# Patient Record
Sex: Female | Born: 1985 | Race: White | Hispanic: No | Marital: Married | Smoking: Never smoker
Health system: Southern US, Community
[De-identification: ages and names within clinical notes are randomized; demographics above are authoritative.]

## PROBLEM LIST (undated history)

## (undated) DIAGNOSIS — O24419 Gestational diabetes mellitus in pregnancy, unspecified control: Secondary | ICD-10-CM

## (undated) DIAGNOSIS — E7800 Pure hypercholesterolemia, unspecified: Secondary | ICD-10-CM

## (undated) DIAGNOSIS — D229 Melanocytic nevi, unspecified: Secondary | ICD-10-CM

## (undated) DIAGNOSIS — F419 Anxiety disorder, unspecified: Secondary | ICD-10-CM

## (undated) HISTORY — DX: Gestational diabetes mellitus in pregnancy, unspecified control: O24.419

## (undated) HISTORY — DX: Anxiety disorder, unspecified: F41.9

---
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
## 2006-11-28 ENCOUNTER — Encounter: Admission: RE | Admit: 2006-11-28 | Discharge: 2006-11-28 | Payer: Self-pay | Admitting: Obstetrics & Gynecology

## 2007-02-02 ENCOUNTER — Inpatient Hospital Stay (HOSPITAL_COMMUNITY): Admission: AD | Admit: 2007-02-02 | Discharge: 2007-02-02 | Payer: Self-pay | Admitting: Obstetrics & Gynecology

## 2007-02-03 ENCOUNTER — Inpatient Hospital Stay (HOSPITAL_COMMUNITY): Admission: AD | Admit: 2007-02-03 | Discharge: 2007-02-05 | Payer: Self-pay | Admitting: Obstetrics and Gynecology

## 2007-02-03 ENCOUNTER — Inpatient Hospital Stay (HOSPITAL_COMMUNITY): Admission: AD | Admit: 2007-02-03 | Discharge: 2007-02-03 | Payer: Self-pay | Admitting: Obstetrics and Gynecology

## 2010-08-15 ENCOUNTER — Inpatient Hospital Stay (HOSPITAL_COMMUNITY): Payer: Managed Care, Other (non HMO)

## 2010-08-15 ENCOUNTER — Inpatient Hospital Stay (HOSPITAL_COMMUNITY)
Admission: AD | Admit: 2010-08-15 | Discharge: 2010-08-16 | Disposition: A | Discharge status: Home / Self Care | Payer: Managed Care, Other (non HMO) | Source: Ambulatory Visit | Attending: Obstetrics & Gynecology | Admitting: Obstetrics & Gynecology

## 2010-08-15 DIAGNOSIS — O209 Hemorrhage in early pregnancy, unspecified: Secondary | ICD-10-CM | POA: Insufficient documentation

## 2010-08-15 LAB — WET PREP, GENITAL
Clue Cells Wet Prep HPF POC: NONE SEEN
Trich, Wet Prep: NONE SEEN
Yeast Wet Prep HPF POC: NONE SEEN

## 2010-08-15 LAB — CBC
HCT: 38.7 % (ref 36.0–46.0)
MCHC: 34.9 g/dL (ref 30.0–36.0)
Platelets: 192 10*3/uL (ref 150–400)
RBC: 4.62 MIL/uL (ref 3.87–5.11)
RDW: 12.8 % (ref 11.5–15.5)

## 2010-08-16 LAB — ABO/RH: ABO/RH(D): O POS

## 2010-08-17 LAB — GC/CHLAMYDIA PROBE AMP, GENITAL
Chlamydia, DNA Probe: NEGATIVE
GC Probe Amp, Genital: NEGATIVE

## 2010-09-09 LAB — ANTIBODY SCREEN: Antibody Screen: NEGATIVE

## 2010-09-09 LAB — GC/CHLAMYDIA PROBE AMP, GENITAL
Chlamydia: NEGATIVE
Gonorrhea: NEGATIVE

## 2010-09-09 LAB — ABO/RH: RH Type: POSITIVE

## 2010-09-09 LAB — HIV ANTIBODY (ROUTINE TESTING W REFLEX): HIV: NONREACTIVE

## 2010-09-09 LAB — RUBELLA ANTIBODY, IGM: Rubella: IMMUNE

## 2010-09-09 LAB — HEPATITIS B SURFACE ANTIGEN: Hepatitis B Surface Ag: NEGATIVE

## 2011-01-19 LAB — CBC
HCT: 35.4 — ABNORMAL LOW
HCT: 39.1
RBC: 4.99

## 2011-01-19 LAB — RPR: RPR Ser Ql: NONREACTIVE

## 2011-01-19 LAB — RAPID HIV SCREEN (WH-MAU): Rapid HIV Screen: NONREACTIVE

## 2011-03-10 LAB — STREP B DNA PROBE: GBS: NEGATIVE

## 2011-03-28 ENCOUNTER — Telehealth (HOSPITAL_COMMUNITY): Payer: Self-pay | Admitting: *Deleted

## 2011-03-28 ENCOUNTER — Encounter (HOSPITAL_COMMUNITY): Payer: Self-pay | Admitting: *Deleted

## 2011-03-28 NOTE — Telephone Encounter (Signed)
Preadmission screen  

## 2011-03-31 ENCOUNTER — Inpatient Hospital Stay (HOSPITAL_COMMUNITY)
Admission: AD | Admit: 2011-03-31 | Discharge: 2011-04-02 | DRG: 775 | Disposition: A | Discharge status: Home / Self Care | Payer: Managed Care, Other (non HMO) | Source: Ambulatory Visit | Attending: Obstetrics and Gynecology | Admitting: Obstetrics and Gynecology

## 2011-03-31 NOTE — Progress Notes (Signed)
PT SAYS SHE  IS AN INDUCTION  AT 0700.  VE ON WED 4 CM- STRIPPED MEMBRANES.    STARTED HURTING BAD AT  9PM.

## 2011-04-01 ENCOUNTER — Inpatient Hospital Stay (HOSPITAL_COMMUNITY): Admission: RE | Admit: 2011-04-01 | Payer: Managed Care, Other (non HMO) | Source: Ambulatory Visit

## 2011-04-01 ENCOUNTER — Encounter (HOSPITAL_COMMUNITY): Payer: Self-pay | Admitting: *Deleted

## 2011-04-01 ENCOUNTER — Inpatient Hospital Stay (HOSPITAL_COMMUNITY): Payer: Managed Care, Other (non HMO) | Admitting: Anesthesiology

## 2011-04-01 ENCOUNTER — Encounter (HOSPITAL_COMMUNITY): Payer: Self-pay | Admitting: Anesthesiology

## 2011-04-01 LAB — RPR: RPR Ser Ql: NONREACTIVE

## 2011-04-01 LAB — CBC
Hemoglobin: 12.5 g/dL (ref 12.0–15.0)
Platelets: 159 10*3/uL (ref 150–400)
RBC: 4.48 MIL/uL (ref 3.87–5.11)
WBC: 12.8 10*3/uL — ABNORMAL HIGH (ref 4.0–10.5)

## 2011-04-01 MED ORDER — SODIUM BICARBONATE 8.4 % IV SOLN
INTRAVENOUS | Status: DC | PRN
  Administered 2011-04-01: 13 mL via EPIDURAL

## 2011-04-01 MED ORDER — WITCH HAZEL-GLYCERIN EX PADS
1.0000 "application " | MEDICATED_PAD | CUTANEOUS | Status: DC | PRN
  Administered 2011-04-01: 1 via TOPICAL

## 2011-04-01 MED ORDER — TETANUS-DIPHTH-ACELL PERTUSSIS 5-2.5-18.5 LF-MCG/0.5 IM SUSP: 0.5000 mL | Freq: Once | INTRAMUSCULAR | Status: DC

## 2011-04-01 MED ORDER — EPHEDRINE 5 MG/ML INJ: 10.0000 mg | INTRAVENOUS | Status: DC | PRN

## 2011-04-01 MED ORDER — LACTATED RINGERS IV SOLN
INTRAVENOUS | Status: DC
  Administered 2011-04-01 (×2): via INTRAVENOUS

## 2011-04-01 MED ORDER — SENNOSIDES-DOCUSATE SODIUM 8.6-50 MG PO TABS
2.0000 | ORAL_TABLET | Freq: Every day | ORAL | Status: DC
  Administered 2011-04-01: 2 via ORAL

## 2011-04-01 MED ORDER — EPHEDRINE 5 MG/ML INJ
10.0000 mg | INTRAVENOUS | Status: DC | PRN
  Filled 2011-04-01: qty 4

## 2011-04-01 MED ORDER — LACTATED RINGERS IV SOLN
500.0000 mL | Freq: Once | INTRAVENOUS | Status: AC
  Administered 2011-04-01: 1000 mL via INTRAVENOUS

## 2011-04-01 MED ORDER — LIDOCAINE HCL (PF) 1 % IJ SOLN
30.0000 mL | INTRAMUSCULAR | Status: DC | PRN
  Filled 2011-04-01: qty 30

## 2011-04-01 MED ORDER — DIBUCAINE 1 % RE OINT
1.0000 "application " | TOPICAL_OINTMENT | RECTAL | Status: DC | PRN
  Administered 2011-04-01: 1 via RECTAL
  Filled 2011-04-01: qty 28

## 2011-04-01 MED ORDER — OXYTOCIN 20 UNITS IN LACTATED RINGERS INFUSION - SIMPLE
125.0000 mL/h | Freq: Once | INTRAVENOUS | Status: AC
  Administered 2011-04-01: 125 mL/h via INTRAVENOUS

## 2011-04-01 MED ORDER — DIPHENHYDRAMINE HCL 25 MG PO CAPS: 25.0000 mg | ORAL_CAPSULE | Freq: Four times a day (QID) | ORAL | Status: DC | PRN

## 2011-04-01 MED ORDER — PROMETHAZINE HCL 25 MG/ML IJ SOLN
12.5000 mg | Freq: Once | INTRAMUSCULAR | Status: AC
  Administered 2011-04-01: 12.5 mg via INTRAVENOUS
  Filled 2011-04-01: qty 1

## 2011-04-01 MED ORDER — FLEET ENEMA 7-19 GM/118ML RE ENEM: 1.0000 | ENEMA | Freq: Every day | RECTAL | Status: DC | PRN

## 2011-04-01 MED ORDER — FLEET ENEMA 7-19 GM/118ML RE ENEM: 1.0000 | ENEMA | RECTAL | Status: DC | PRN

## 2011-04-01 MED ORDER — PHENYLEPHRINE 40 MCG/ML (10ML) SYRINGE FOR IV PUSH (FOR BLOOD PRESSURE SUPPORT)
80.0000 ug | PREFILLED_SYRINGE | INTRAVENOUS | Status: DC | PRN
  Filled 2011-04-01: qty 5

## 2011-04-01 MED ORDER — OXYTOCIN BOLUS FROM INFUSION
500.0000 mL | Freq: Once | INTRAVENOUS | Status: DC
  Filled 2011-04-01: qty 500
  Filled 2011-04-01: qty 1000

## 2011-04-01 MED ORDER — SIMETHICONE 80 MG PO CHEW: 80.0000 mg | CHEWABLE_TABLET | ORAL | Status: DC | PRN

## 2011-04-01 MED ORDER — ONDANSETRON HCL 4 MG PO TABS: 4.0000 mg | ORAL_TABLET | ORAL | Status: DC | PRN

## 2011-04-01 MED ORDER — IBUPROFEN 600 MG PO TABS
600.0000 mg | ORAL_TABLET | Freq: Four times a day (QID) | ORAL | Status: DC
  Administered 2011-04-01 – 2011-04-02 (×5): 600 mg via ORAL
  Filled 2011-04-01 (×5): qty 1

## 2011-04-01 MED ORDER — BISACODYL 10 MG RE SUPP: 10.0000 mg | Freq: Every day | RECTAL | Status: DC | PRN

## 2011-04-01 MED ORDER — IBUPROFEN 600 MG PO TABS: 600.0000 mg | ORAL_TABLET | Freq: Four times a day (QID) | ORAL | Status: DC | PRN

## 2011-04-01 MED ORDER — OXYCODONE-ACETAMINOPHEN 5-325 MG PO TABS: 1.0000 | ORAL_TABLET | ORAL | Status: DC | PRN

## 2011-04-01 MED ORDER — FENTANYL 2.5 MCG/ML BUPIVACAINE 1/10 % EPIDURAL INFUSION (WH - ANES)
14.0000 mL/h | INTRAMUSCULAR | Status: DC
  Filled 2011-04-01: qty 60

## 2011-04-01 MED ORDER — DIPHENHYDRAMINE HCL 50 MG/ML IJ SOLN: 12.5000 mg | INTRAMUSCULAR | Status: DC | PRN

## 2011-04-01 MED ORDER — ONDANSETRON HCL 4 MG/2ML IJ SOLN: 4.0000 mg | Freq: Four times a day (QID) | INTRAMUSCULAR | Status: DC | PRN

## 2011-04-01 MED ORDER — PHENYLEPHRINE 40 MCG/ML (10ML) SYRINGE FOR IV PUSH (FOR BLOOD PRESSURE SUPPORT): 80.0000 ug | PREFILLED_SYRINGE | INTRAVENOUS | Status: DC | PRN

## 2011-04-01 MED ORDER — BENZOCAINE-MENTHOL 20-0.5 % EX AERO
INHALATION_SPRAY | CUTANEOUS | Status: AC
  Administered 2011-04-01: 13:00:00
  Filled 2011-04-01: qty 56

## 2011-04-01 MED ORDER — BENZOCAINE-MENTHOL 20-0.5 % EX AERO: 1.0000 "application " | INHALATION_SPRAY | CUTANEOUS | Status: DC | PRN

## 2011-04-01 MED ORDER — LACTATED RINGERS IV SOLN: 500.0000 mL | INTRAVENOUS | Status: DC | PRN

## 2011-04-01 MED ORDER — CITRIC ACID-SODIUM CITRATE 334-500 MG/5ML PO SOLN: 30.0000 mL | ORAL | Status: DC | PRN

## 2011-04-01 MED ORDER — PRENATAL MULTIVITAMIN CH
1.0000 | ORAL_TABLET | Freq: Every day | ORAL | Status: DC
  Administered 2011-04-02: 1 via ORAL
  Filled 2011-04-01: qty 1

## 2011-04-01 MED ORDER — ACETAMINOPHEN 325 MG PO TABS: 650.0000 mg | ORAL_TABLET | ORAL | Status: DC | PRN

## 2011-04-01 MED ORDER — FENTANYL 2.5 MCG/ML BUPIVACAINE 1/10 % EPIDURAL INFUSION (WH - ANES)
INTRAMUSCULAR | Status: DC | PRN
  Administered 2011-04-01: 12 mL/h via EPIDURAL

## 2011-04-01 MED ORDER — ONDANSETRON HCL 4 MG/2ML IJ SOLN: 4.0000 mg | INTRAMUSCULAR | Status: DC | PRN

## 2011-04-01 MED ORDER — OXYCODONE-ACETAMINOPHEN 5-325 MG PO TABS: 2.0000 | ORAL_TABLET | ORAL | Status: DC | PRN

## 2011-04-01 MED ORDER — BUTORPHANOL TARTRATE 2 MG/ML IJ SOLN
1.0000 mg | Freq: Once | INTRAMUSCULAR | Status: AC
  Administered 2011-04-01: 1 mg via INTRAVENOUS
  Filled 2011-04-01: qty 1

## 2011-04-01 MED ORDER — ZOLPIDEM TARTRATE 5 MG PO TABS: 5.0000 mg | ORAL_TABLET | Freq: Every evening | ORAL | Status: DC | PRN

## 2011-04-01 MED ORDER — LANOLIN HYDROUS EX OINT: TOPICAL_OINTMENT | CUTANEOUS | Status: DC | PRN

## 2011-04-01 NOTE — Anesthesia Procedure Notes (Signed)

## 2011-04-01 NOTE — ED Notes (Signed)
Informed that patient is for induction this am. Received orders to admit to birthing suites.

## 2011-04-01 NOTE — Progress Notes (Signed)
Delivery Note Rapid delivery over 1 UC SVD VFI, LOA, nuchal cord x 1 reduced Apgars 9/10 Placenta 3 vessels intact EBL  300cc Cx/vag intact Small second degree midline lac repaired with single figure 8 Pt/infant stable in LDR

## 2011-04-01 NOTE — Anesthesia Postprocedure Evaluation (Signed)
  Anesthesia Post-op Note  Patient: Diane Peterson  Procedure(s) Performed: * No procedures listed *  Patient Location: PACU and Mother/Baby  Anesthesia Type: Epidural  Level of Consciousness: awake, alert , oriented and patient cooperative  Airway and Oxygen Therapy: Patient Spontanous Breathing  Post-op Pain: none  Post-op Assessment: Post-op Vital signs reviewed  Post-op Vital Signs: Reviewed and stable  Complications: No apparent anesthesia complications

## 2011-04-01 NOTE — H&P (Signed)
Diane Peterson is a 25 y.o. female presenting for C/O contractions last pm. Admitted to L&D. No CNS change/epigastric pain. Maternal Medical History:  Reason for admission: Reason for admission: contractions.  Contractions: Onset was 6-12 hours ago.   Frequency: regular.    Fetal activity: Perceived fetal activity is normal.      OB History    Grav Para Term Preterm Abortions TAB SAB Ect Mult Living   2 1 1       1      Past Medical History  Diagnosis Date  . Gestational diabetes     first preg   No past surgical history on file. Family History: family history includes Alcohol abuse in her father; Cancer in her maternal grandfather and maternal grandmother; Diabetes in her father and paternal grandmother; Drug abuse in her father; Hypertension in her paternal grandmother; Lupus in her paternal grandmother; Peripheral vascular disease in her maternal grandfather, maternal grandmother, and paternal grandmother; Seizures in her father; Stroke in her paternal grandfather; and Thyroid disease in her paternal grandmother. Social History:  reports that she has never smoked. She has never used smokeless tobacco. She reports that she does not drink alcohol or use illicit drugs.  Review of Systems  Eyes: Negative for blurred vision.  Gastrointestinal: Negative for abdominal pain.  Neurological: Negative for headaches.    Dilation: 4 Effacement (%): 70 Station: -2 Exam by:: H.Norton, RN  Blood pressure 112/66, pulse 99, temperature 98.9 F (37.2 C), temperature source Oral, resp. rate 18, height 5\' 1"  (1.549 m), weight 57.607 kg (127 lb), last menstrual period 06/12/2010. Maternal Exam:  Uterine Assessment: Contraction strength is firm.  Contraction frequency is regular.   Abdomen: Patient reports no abdominal tenderness.   Physical Exam  Respiratory: Effort normal.  Neurological: She has normal reflexes.   Cx 5/c/-2/vtx AROM clear UCs about q3-5 min FHT + accels , now after  stadol decreased accels Prenatal labs: ABO, Rh: O/Positive/-- (05/31 0000) Antibody: Negative (05/31 0000) Rubella: Immune (05/31 0000) RPR: Nonreactive (05/31 0000)  HBsAg: Negative (05/31 0000)  HIV: Non-reactive (05/31 0000)  GBS: Negative (11/29 0000)   Assessment/Plan: 25 yo G2P1 at 30 wks in labor Epidural prn   Ameah Chanda II,Viola Kinnick E 04/01/2011, 7:40 AM

## 2011-04-01 NOTE — Progress Notes (Signed)
Contraction about 10 min with back pain about 21:00. When I wipe its very mucus.

## 2011-04-01 NOTE — Anesthesia Preprocedure Evaluation (Signed)
Anesthesia Evaluation  Patient identified by MRN, date of birth, ID band Patient awake    Reviewed: Allergy & Precautions, H&P , Patient's Chart, lab work & pertinent test results  Airway Mallampati: II TM Distance: >3 FB Neck ROM: full    Dental  (+) Teeth Intact   Pulmonary  clear to auscultation        Cardiovascular regular Normal    Neuro/Psych    GI/Hepatic   Endo/Other  Gestational  Renal/GU      Musculoskeletal   Abdominal   Peds  Hematology   Anesthesia Other Findings       Reproductive/Obstetrics (+) Pregnancy                           Anesthesia Physical Anesthesia Plan  ASA: II  Anesthesia Plan: Epidural   Post-op Pain Management:    Induction:   Airway Management Planned:   Additional Equipment:   Intra-op Plan:   Post-operative Plan:   Informed Consent: I have reviewed the patients History and Physical, chart, labs and discussed the procedure including the risks, benefits and alternatives for the proposed anesthesia with the patient or authorized representative who has indicated his/her understanding and acceptance.   Dental Advisory Given  Plan Discussed with:   Anesthesia Plan Comments: (Labs checked- platelets confirmed with RN in room. Fetal heart tracing, per RN, reported to be stable enough for sitting procedure. Discussed epidural, and patient consents to the procedure:  included risk of possible headache,backache, failed block, allergic reaction, and nerve injury. This patient was asked if she had any questions or concerns before the procedure started. )        Anesthesia Quick Evaluation

## 2011-04-02 LAB — CBC
Hemoglobin: 11.8 g/dL — ABNORMAL LOW (ref 12.0–15.0)
Platelets: 164 10*3/uL (ref 150–400)
RBC: 4.15 MIL/uL (ref 3.87–5.11)
WBC: 13.5 10*3/uL — ABNORMAL HIGH (ref 4.0–10.5)

## 2011-04-02 MED ORDER — OXYCODONE-ACETAMINOPHEN 5-325 MG PO TABS: 1.0000 | ORAL_TABLET | ORAL | Status: DC | PRN

## 2011-04-02 MED ORDER — IBUPROFEN 600 MG PO TABS: 600.0000 mg | ORAL_TABLET | Freq: Four times a day (QID) | ORAL | Status: AC

## 2011-04-02 MED ORDER — PRENATAL MULTIVITAMIN CH: 1.0000 | ORAL_TABLET | Freq: Every day | ORAL | Status: DC

## 2011-04-02 MED ORDER — BENZOCAINE-MENTHOL 20-0.5 % EX AERO
INHALATION_SPRAY | CUTANEOUS | Status: AC
  Filled 2011-04-02: qty 56

## 2011-04-02 NOTE — Progress Notes (Signed)
Pt wants to go home Decreasing lochia Good pain relief, voiding  Blood pressure 122/78, pulse 80, temperature 98.4 F (36.9 C), temperature source Oral, resp. rate 80, height 5\' 1"  (1.549 m), weight 57.607 kg (127 lb), last menstrual period 06/12/2010, SpO2 99.00%, unknown if currently breastfeeding.  Resp Rate =16 Lungs CTA FFNT  D/C home Instructions reviewed

## 2011-04-02 NOTE — Discharge Summary (Signed)
Obstetric Discharge Summary Reason for Admission: onset of labor Prenatal Procedures: ultrasound Intrapartum Procedures: spontaneous vaginal delivery Postpartum Procedures: none Complications-Operative and Postpartum: none Hemoglobin  Date Value Range Status  04/02/2011 11.8* 12.0-15.0 (g/dL) Final     HCT  Date Value Range Status  04/02/2011 34.8* 36.0-46.0 (%) Final    Discharge Diagnoses: Term Pregnancy-delivered  Discharge Information: Date: 04/02/2011 Activity: pelvic rest Diet: routine Medications: PNV, Ibuprofen and Percocet Condition: stable Instructions: refer to practice specific booklet Discharge to: home Follow-up Information    Make an appointment in 6 weeks to follow up.         Newborn Data: Live born female  Birth Weight: 8 lb 0.2 oz (3634 g) APGAR: 9, 10  Home with mother.  Shekita Boyden II,Jessice Madill E 04/02/2011, 6:38 AM

## 2011-04-05 ENCOUNTER — Encounter (HOSPITAL_COMMUNITY): Payer: Self-pay | Admitting: *Deleted

## 2011-04-05 ENCOUNTER — Inpatient Hospital Stay (HOSPITAL_COMMUNITY)
Admission: AD | Admit: 2011-04-05 | Discharge: 2011-04-05 | Disposition: A | Discharge status: Home / Self Care | Payer: Managed Care, Other (non HMO) | Source: Ambulatory Visit | Attending: Obstetrics and Gynecology | Admitting: Obstetrics and Gynecology

## 2011-04-05 DIAGNOSIS — IMO0002 Reserved for concepts with insufficient information to code with codable children: Secondary | ICD-10-CM | POA: Insufficient documentation

## 2011-04-05 DIAGNOSIS — R609 Edema, unspecified: Secondary | ICD-10-CM

## 2011-04-05 LAB — CBC
HCT: 37.1 % (ref 36.0–46.0)
Hemoglobin: 12.5 g/dL (ref 12.0–15.0)
MCV: 84.3 fL (ref 78.0–100.0)
RBC: 4.4 MIL/uL (ref 3.87–5.11)
WBC: 9.9 10*3/uL (ref 4.0–10.5)

## 2011-04-05 LAB — URINALYSIS, ROUTINE W REFLEX MICROSCOPIC
Bilirubin Urine: NEGATIVE
Glucose, UA: NEGATIVE mg/dL
Hgb urine dipstick: NEGATIVE
Specific Gravity, Urine: 1.015 (ref 1.005–1.030)
Urobilinogen, UA: 0.2 mg/dL (ref 0.0–1.0)
pH: 6 (ref 5.0–8.0)

## 2011-04-05 NOTE — Progress Notes (Signed)
Pt had a NSVD on 04/01/2011-states her bleeding is almost gone but wokr up tonight with swelling in her face and hand that concerned her

## 2011-04-05 NOTE — ED Provider Notes (Signed)
History   Pt presents today c/o swelling in her face and hands. She states when she woke up tonight, she felt like her face and hands were swelling. She denies itching, sore throat, SOB, chest pain, fever, or any other sx. She states she took some benadryl and called the "on-call" nurse for her practice and was told to come to the MAU for evaluation.  Chief Complaint  Patient presents with  . Facial Swelling   HPI  OB History    Grav Para Term Preterm Abortions TAB SAB Ect Mult Living   2 2 2       2       Past Medical History  Diagnosis Date  . Gestational diabetes     first preg    No past surgical history on file.  Family History  Problem Relation Age of Onset  . Alcohol abuse Father   . Drug abuse Father   . Diabetes Father   . Seizures Father   . Peripheral vascular disease Maternal Grandmother   . Cancer Maternal Grandmother     uterine  . Peripheral vascular disease Maternal Grandfather   . Cancer Maternal Grandfather     prostate  . Lupus Paternal Grandmother   . Thyroid disease Paternal Grandmother   . Diabetes Paternal Grandmother   . Hypertension Paternal Grandmother   . Peripheral vascular disease Paternal Grandmother   . Stroke Paternal Grandfather     History  Substance Use Topics  . Smoking status: Never Smoker   . Smokeless tobacco: Never Used  . Alcohol Use: No    Allergies:  Allergies  Allergen Reactions  . Sulfa Antibiotics     Prescriptions prior to admission  Medication Sig Dispense Refill  . ibuprofen (ADVIL,MOTRIN) 600 MG tablet Take 1 tablet (600 mg total) by mouth every 6 (six) hours.  30 tablet  0  . oxyCODONE-acetaminophen (PERCOCET) 5-325 MG per tablet Take 1-2 tablets by mouth every 3 (three) hours as needed (moderate - severe pain).  20 tablet  0  . Prenatal Vit-Fe Fumarate-FA (PRENATAL MULTIVITAMIN) TABS Take 1 tablet by mouth daily.  30 tablet  1    Review of Systems  Constitutional: Negative for fever.  Eyes: Negative  for blurred vision.  Respiratory: Negative for cough, hemoptysis, sputum production, shortness of breath and wheezing.   Cardiovascular: Negative for chest pain and palpitations.  Gastrointestinal: Negative for nausea, vomiting, abdominal pain, diarrhea and constipation.  Genitourinary: Negative for dysuria, urgency, frequency and hematuria.  Neurological: Negative for dizziness and headaches.  Psychiatric/Behavioral: Negative for depression and suicidal ideas.   Physical Exam   Blood pressure 142/89, pulse 66, temperature 99 F (37.2 C), temperature source Oral, resp. rate 20, height 5\' 1"  (1.549 m), weight 116 lb (52.617 kg), last menstrual period 06/12/2010, unknown if currently breastfeeding.  Physical Exam  Nursing note and vitals reviewed. Constitutional: She is oriented to person, place, and time. She appears well-developed and well-nourished. No distress.  HENT:  Head: Normocephalic and atraumatic.  Eyes: EOM are normal. Pupils are equal, round, and reactive to light.  Cardiovascular: Normal rate, regular rhythm and normal heart sounds.  Exam reveals no gallop and no friction rub.   No murmur heard. Respiratory: Effort normal and breath sounds normal. No respiratory distress. She has no wheezes. She has no rales. She exhibits no tenderness.  GI: Soft. She exhibits no distension. There is no tenderness. There is no rebound and no guarding.  Neurological: She is alert and oriented to person,  place, and time.  Skin: Skin is warm and dry. She is not diaphoretic.  Psychiatric: She has a normal mood and affect. Her behavior is normal. Judgment and thought content normal.    MAU Course  Procedures  Results for orders placed during the hospital encounter of 04/05/11 (from the past 24 hour(s))  CBC     Status: Normal   Collection Time   04/05/11  4:00 AM      Component Value Range   WBC 9.9  4.0 - 10.5 (K/uL)   RBC 4.40  3.87 - 5.11 (MIL/uL)   Hemoglobin 12.5  12.0 - 15.0 (g/dL)     HCT 14.7  82.9 - 56.2 (%)   MCV 84.3  78.0 - 100.0 (fL)   MCH 28.4  26.0 - 34.0 (pg)   MCHC 33.7  30.0 - 36.0 (g/dL)   RDW 13.0  86.5 - 78.4 (%)   Platelets 210  150 - 400 (K/uL)  URINALYSIS, ROUTINE W REFLEX MICROSCOPIC     Status: Normal   Collection Time   04/05/11  5:00 AM      Component Value Range   Color, Urine YELLOW  YELLOW    APPearance CLEAR  CLEAR    Specific Gravity, Urine 1.015  1.005 - 1.030    pH 6.0  5.0 - 8.0    Glucose, UA NEGATIVE  NEGATIVE (mg/dL)   Hgb urine dipstick NEGATIVE  NEGATIVE    Bilirubin Urine NEGATIVE  NEGATIVE    Ketones, ur NEGATIVE  NEGATIVE (mg/dL)   Protein, ur NEGATIVE  NEGATIVE (mg/dL)   Urobilinogen, UA 0.2  0.0 - 1.0 (mg/dL)   Nitrite NEGATIVE  NEGATIVE    Leukocytes, UA NEGATIVE  NEGATIVE       Assessment and Plan  Edema: discussed with pt at length. No evidence of edema at this time. BP NL. CBC NL. Pt will f/u with Dr. Rana Snare. Discussed diet, activity, risks,and precautions.  Clinton Gallant. Kathreen Dileo III, DrHSc, MPAS, PA-C  04/05/2011, 4:52 AM   Henrietta Hoover, PA 04/05/11 [phone]

## 2011-12-07 IMAGING — US US OB COMP LESS 14 WK
1 series · 14 of 28 positions shown · non-contrast
Comparison: None.

CLINICAL DATA: Vaginal bleeding.  LMP 06/12/2010 consistent with
estimated gestational age 9 weeks 1 day.

OBSTETRIC <14 WK US AND TRANSVAGINAL OB US
TECHNIQUE: Both transabdominal and transvaginal ultrasound
examinations were performed for complete evaluation of the
gestation as well as the maternal uterus, adnexal regions, and
pelvic cul-de-sac.  Transvaginal technique was performed to assess
early pregnancy.

[Series 1: us ob comp less 14 wks · 14 of 55 slices shown]
[im 3/55]
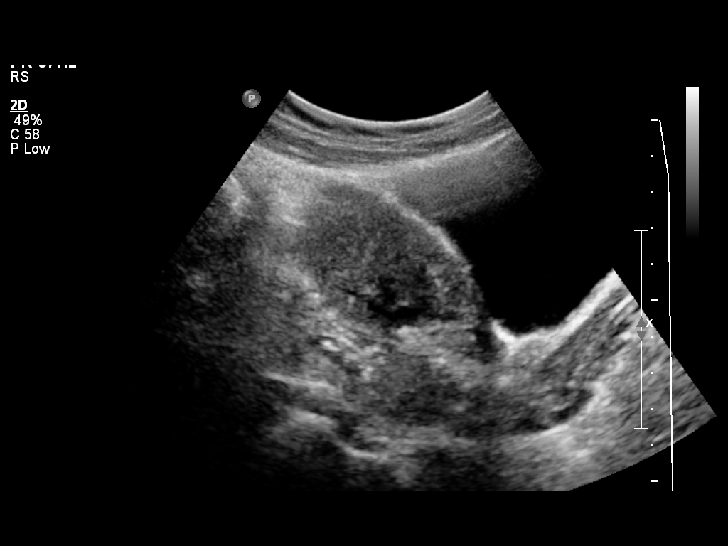
[im 7/55]
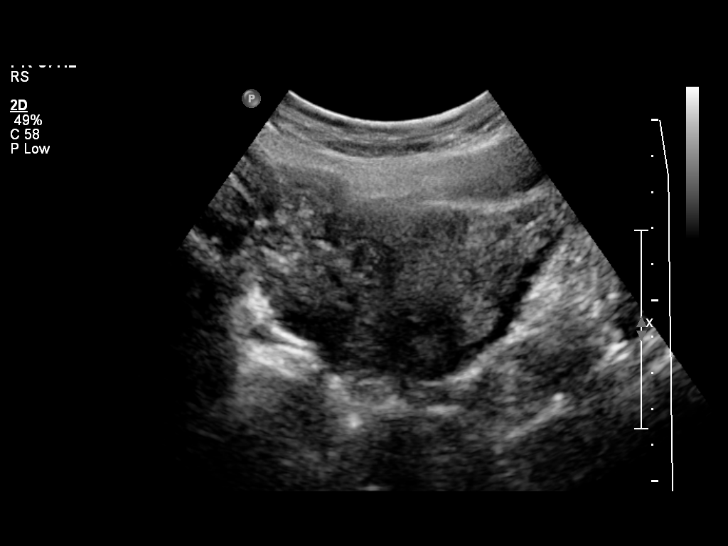
[im 11/55]
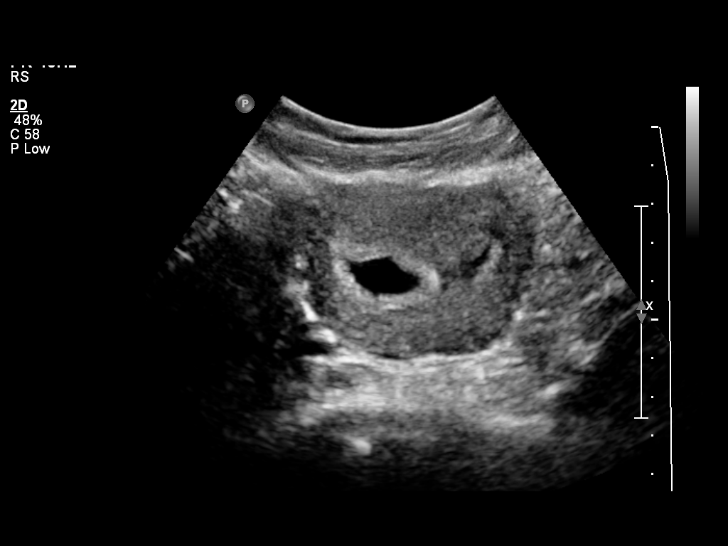
[im 15/55]
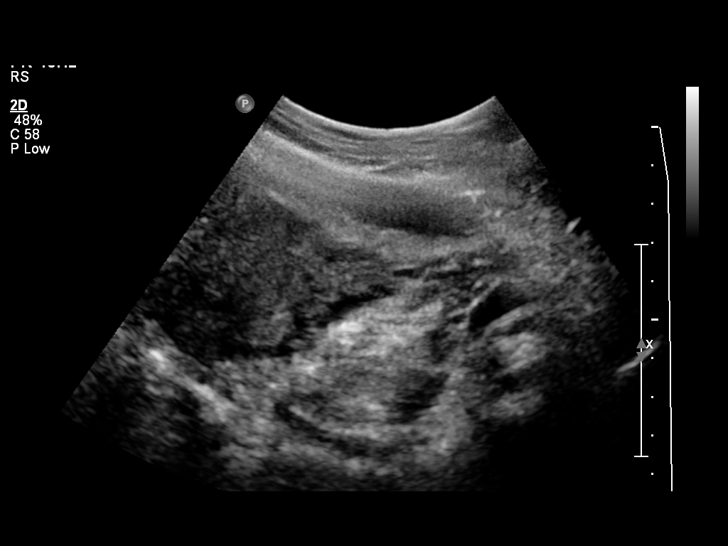
[im 19/55]
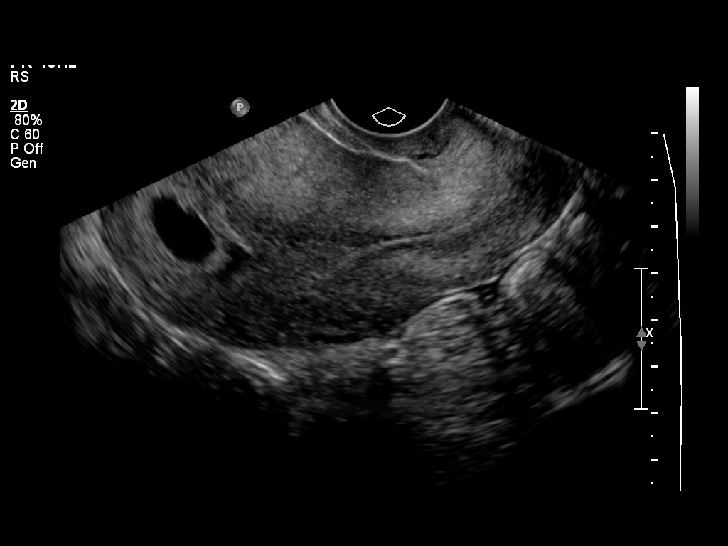
[im 23/55]
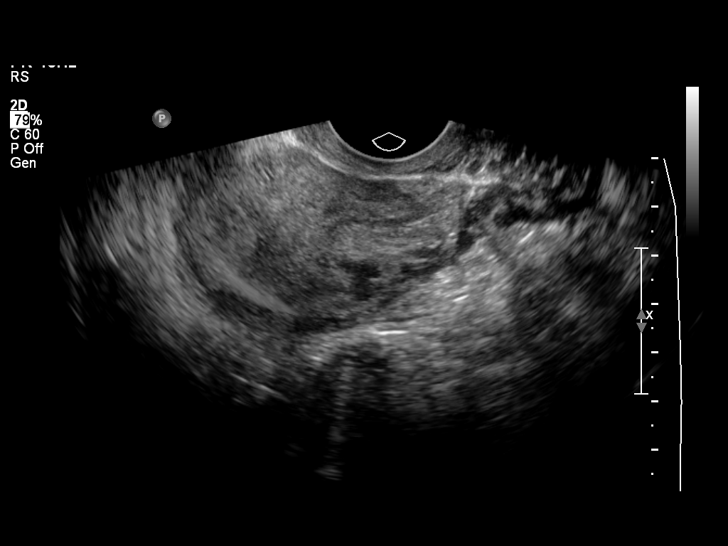
[im 27/55]
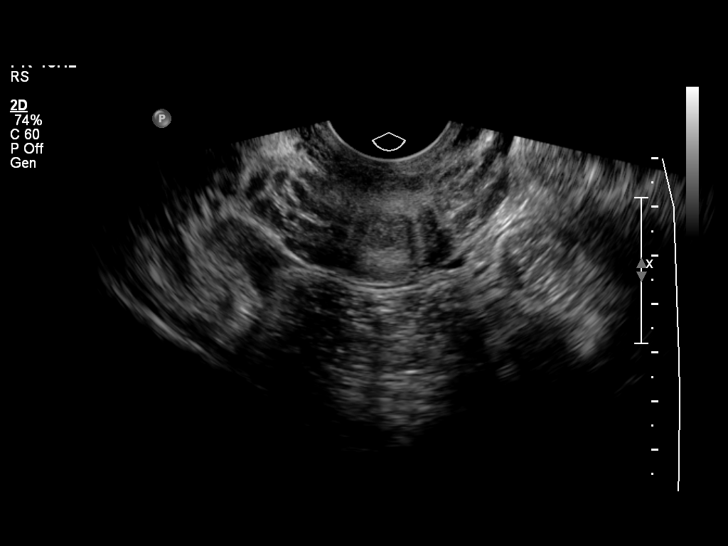
[im 31/55]
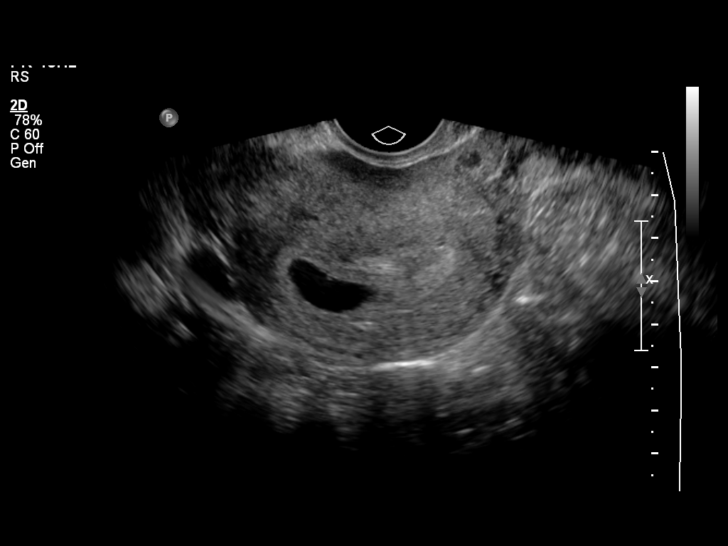
[im 35/55]
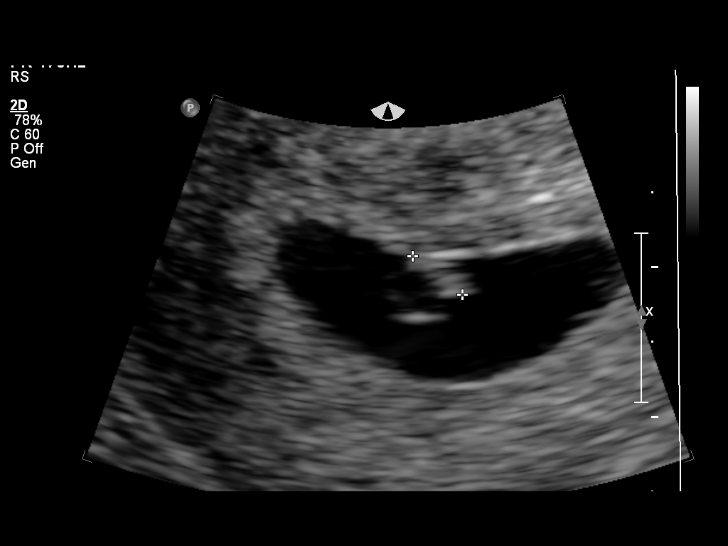
[im 39/55]
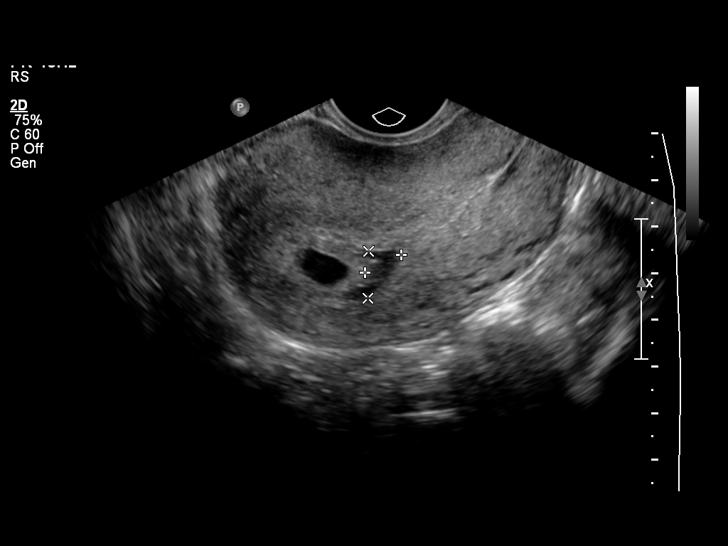
[im 43/55]
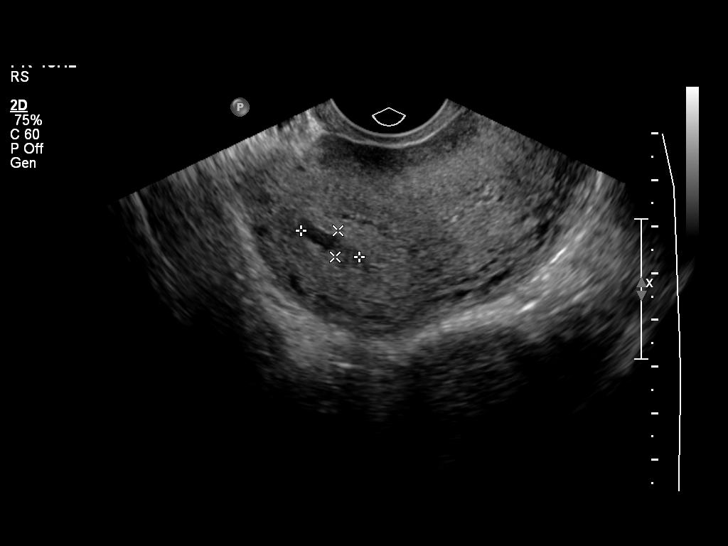
[im 47/55]
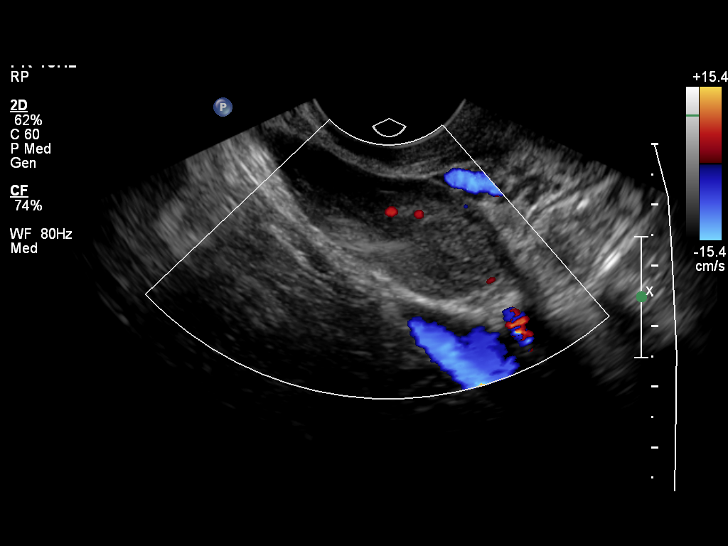
[im 51/55]
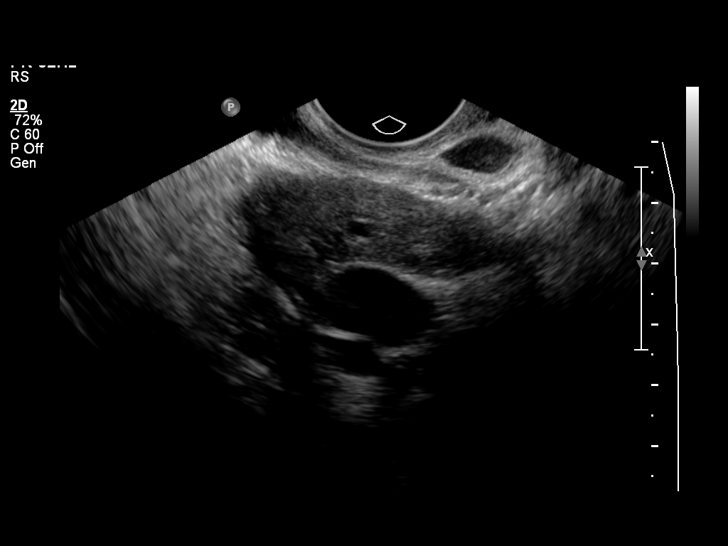
[im 55/55]
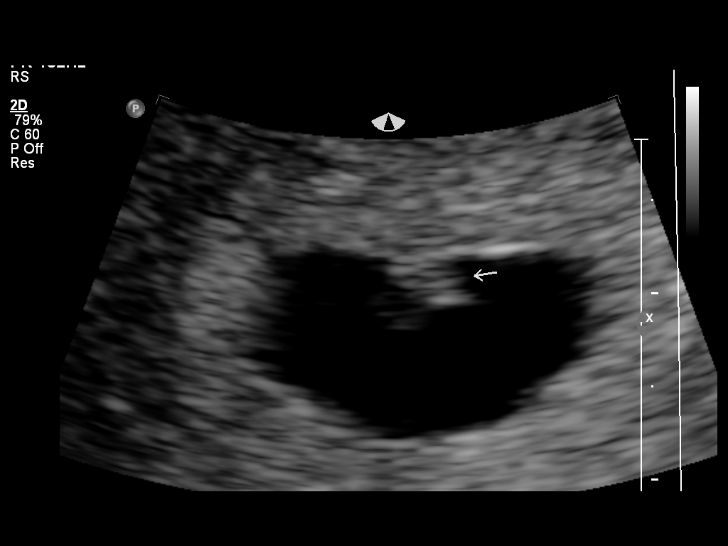

[14 of 28 positions shown; findings below may reference images not displayed]

Intrauterine gestational sac:  Single intrauterine gestational sac
visualized in the fundus.
Yolk sac: Present
Embryo: Present
Cardiac Activity: Demonstrated
Heart Rate: 117 bpm

CRL: 4.6   mm  6   w  2   d         US EDC: 04/08/2011

Maternal uterus/adnexae:
Small subchorionic hemorrhage demonstrated inferior to the
gestational sac.  No myometrial mass is noted.  Ovaries appear
symmetrical without abnormal adnexal mass lesion.  No significant
free fluid.
IMPRESSION: Single intrauterine pregnancy with fetal pole and yolk sac
visualized.  Estimated gestational age by crown-rump length is 6
weeks 2 days.  Small subchorionic hemorrhage noted.

## 2014-02-10 ENCOUNTER — Encounter (HOSPITAL_COMMUNITY): Payer: Self-pay | Admitting: *Deleted

## 2018-10-15 ENCOUNTER — Encounter: Payer: Self-pay | Admitting: *Deleted

## 2019-04-12 HISTORY — PX: BREAST ENHANCEMENT SURGERY: SHX7

## 2020-05-20 ENCOUNTER — Other Ambulatory Visit: Payer: Self-pay

## 2020-05-20 ENCOUNTER — Ambulatory Visit
Admission: EM | Admit: 2020-05-20 | Discharge: 2020-05-20 | Disposition: A | Discharge status: Home / Self Care | Payer: 59

## 2020-05-20 DIAGNOSIS — B349 Viral infection, unspecified: Secondary | ICD-10-CM | POA: Diagnosis not present

## 2020-05-20 DIAGNOSIS — R5383 Other fatigue: Secondary | ICD-10-CM

## 2020-05-20 DIAGNOSIS — R21 Rash and other nonspecific skin eruption: Secondary | ICD-10-CM

## 2020-05-20 MED ORDER — DEXAMETHASONE SODIUM PHOSPHATE 10 MG/ML IJ SOLN
10.0000 mg | Freq: Once | INTRAMUSCULAR | Status: AC
  Administered 2020-05-20: 10 mg via INTRAMUSCULAR

## 2020-05-20 MED ORDER — TRIAMCINOLONE ACETONIDE 0.1 % EX CREA: 1.0000 "application " | TOPICAL_CREAM | Freq: Two times a day (BID) | CUTANEOUS | 0 refills | Status: AC

## 2020-05-20 NOTE — ED Provider Notes (Signed)
Diane Peterson   [id number] 05/20/20 Arrival Time: 0809   CC: COVID symptoms  SUBJECTIVE: History from: patient.  Diane Peterson is a 35 y.o. female who presents with cough, weakness, and rash for the last 3 days. Reports that the rash is present to her arms and face. Reports that the rash is red, itchy and burning. Denies sick exposure to COVID, flu or strep. Denies recent travel. Has negative history of Covid. Has completed Covid vaccines. Has taken OTC cough and cold for this with little relief. There are no aggravating or alleviating factors. Denies previous symptoms in the past. Denies fever, chills, fatigue, sinus pain, rhinorrhea, sore throat, SOB, wheezing, chest pain, nausea, changes in bowel or bladder habits.    ROS: As per HPI.  All other pertinent ROS negative.     Past Medical History:  Diagnosis Date  . 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 Atypical nevus 09/20/2002   features of dys nevus-right upper arm superior  . 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 Gestational diabetes    first preg   History reviewed. No pertinent surgical history. Allergies  Allergen Reactions  . Sulfa Antibiotics Rash and Other (See Comments)    High temperature   No current facility-administered medications on file prior to encounter.   Current Outpatient Medications on File Prior to Encounter  Medication Sig Dispense Refill  . ALPRAZolam (XANAX) 0.25 MG tablet alprazolam 0.25 mg tablet    . sertraline (ZOLOFT) 25 MG tablet sertraline 25 mg tablet  TAKE 1 TABLET BY MOUTH EVERY DAY    . levonorgestrel-ethinyl estradiol (SEASONALE) 0.15-0.03 MG tablet Take 1 tablet by mouth daily.    . Multiple Vitamin (MULITIVITAMIN WITH MINERALS) TABS Take 1 tablet by  mouth daily.      . rosuvastatin (CRESTOR) 10 MG tablet Take 10 mg by mouth at bedtime.     Social History   Socioeconomic History  . Marital status: Married    Spouse name: Not on file  . Number of children: Not on file  . Years of education: Not on file  . Highest education level: Not on file  Occupational History  . Not on file  Tobacco Use  . Smoking status: Never Smoker  . Smokeless tobacco: Never Used  Substance and Sexual Activity  . Alcohol use: No  . Drug use: No  . Sexual activity: Yes  Other Topics Concern  . Not on file  Social History Narrative  . Not on file   Social Determinants of Health   Financial Resource Strain: Not on file  Food Insecurity: Not on file  Transportation Needs: Not on file  Physical Activity: Not on file  Stress: Not on file  Social Connections: Not on file  Intimate Partner Violence: Not on file   Family History  Problem Relation Age of Onset  . Alcohol abuse Father   . Drug abuse Father   . Diabetes Father   . Seizures Father   . Peripheral vascular disease Maternal Grandmother   . Cancer Maternal Grandmother        uterine  . Peripheral vascular disease Maternal Grandfather   . Cancer Maternal Grandfather        prostate  . Lupus Paternal Grandmother   . Thyroid disease Paternal  Grandmother   . Diabetes Paternal Grandmother   . Hypertension Paternal Grandmother   . Peripheral vascular disease Paternal Grandmother   . Stroke Paternal Grandfather     OBJECTIVE:  Vitals:   05/20/20 0821  BP: 119/85  Pulse: 91  Resp: 18  Temp: 98.4 F (36.9 C)  SpO2: 97%     General appearance: alert; appears fatigued, but nontoxic; speaking in full sentences and tolerating own secretions HEENT: NCAT; Ears: EACs clear, TMs pearly gray; Eyes: PERRL.  EOM grossly intact. Sinuses: nontender; Nose: nares patent with clear rhinorrhea, Throat: oropharynx erythematous, cobblestoning present, tonsils non erythematous or enlarged, uvula  midline  Neck: supple without LAD Lungs: unlabored respirations, symmetrical air entry; cough: absent; no respiratory distress; CTAB Heart: regular rate and rhythm.  Radial pulses 2+ symmetrical bilaterally Skin: warm and dry, erythematous maculopapular rash to face and bilateral forearms Psychological: alert and cooperative; normal mood and affect  LABS:  No results found for this or any previous visit (from the past 24 hour(s)).   ASSESSMENT & PLAN:  1. Viral illness   2. Rash and nonspecific skin eruption   3. Other fatigue     Meds ordered this encounter  Medications  . dexamethasone (DECADRON) injection 10 mg  . triamcinolone (KENALOG) 0.1 %    Sig: Apply 1 application topically 2 (two) times daily.    Dispense:  30 g    Refill:  0    Order Specific Question:   Supervising Provider    Answer:   Chase Picket [id number]    Decadron given in office Triamcinolone cream prescribed  Continue supportive care at home COVID and flu testing ordered.  It will take between 2-3 days for test results. Someone will contact you regarding abnormal results.   Work note provided Patient should remain in quarantine until they have received Covid results.  If negative you may resume normal activities (go back to work/school) while practicing hand hygiene, social distance, and mask wearing.  If positive, patient should remain in quarantine for at least 5 days from symptom onset AND greater than 72 hours after symptoms resolution (absence of fever without the use of fever-reducing medication and improvement in respiratory symptoms), whichever is longer Get plenty of rest and push fluids Use OTC zyrtec for nasal congestion, runny nose, and/or sore throat Use OTC flonase for nasal congestion and runny nose Use medications daily for symptom relief Use OTC medications like ibuprofen or tylenol as needed fever or pain Call or go to the ED if you have any new or worsening symptoms such as fever,  worsening cough, shortness of breath, chest tightness, chest pain, turning blue, changes in mental status.  Reviewed expectations re: course of current medical issues. Questions answered. Outlined signs and symptoms indicating need for more acute intervention. Patient verbalized understanding. After Visit Summary given.         Faustino Congress, NP 05/20/20 0840

## 2020-05-20 NOTE — Discharge Instructions (Addendum)
You have a received a steroid injection in the office today  I have sent in triamcinolone cream for you to use to the area twice a day as needed.  Your COVID test is pending.  You should self quarantine until the test result is back.    Take Tylenol or ibuprofen as needed for fever or discomfort.  Rest and keep yourself hydrated.    Follow-up with your primary care provider if your symptoms are not improving.

## 2020-05-20 NOTE — ED Triage Notes (Signed)
Pt presents with c/o cough and fatigue for past few days, also has developed rash on face and arms

## 2020-05-21 LAB — SARS-COV-2, NAA 2 DAY TAT

## 2020-05-21 LAB — NOVEL CORONAVIRUS, NAA: SARS-CoV-2, NAA: NOT DETECTED

## 2021-03-20 ENCOUNTER — Ambulatory Visit
Admission: RE | Admit: 2021-03-20 | Discharge: 2021-03-20 | Disposition: A | Discharge status: Home / Self Care | Payer: 59 | Source: Ambulatory Visit | Attending: Urgent Care | Admitting: Urgent Care

## 2021-03-20 ENCOUNTER — Other Ambulatory Visit: Payer: Self-pay

## 2021-03-20 VITALS — BP 154/91 | HR 97 | Temp 98.1°F | Resp 18

## 2021-03-20 DIAGNOSIS — R102 Pelvic and perineal pain: Secondary | ICD-10-CM | POA: Insufficient documentation

## 2021-03-20 DIAGNOSIS — N898 Other specified noninflammatory disorders of vagina: Secondary | ICD-10-CM | POA: Diagnosis present

## 2021-03-20 HISTORY — DX: Pure hypercholesterolemia, unspecified: E78.00

## 2021-03-20 LAB — POCT URINALYSIS DIP (MANUAL ENTRY)
Bilirubin, UA: NEGATIVE
Glucose, UA: NEGATIVE mg/dL
Ketones, POC UA: NEGATIVE mg/dL
Leukocytes, UA: NEGATIVE
Nitrite, UA: NEGATIVE
Protein Ur, POC: NEGATIVE mg/dL
Spec Grav, UA: >=1.03 — AB (ref 1.010–1.025)
Urobilinogen, UA: 0.2 E.U./dL
pH, UA: 5.5 (ref 5.0–8.0)

## 2021-03-20 LAB — POCT URINE PREGNANCY: Preg Test, Ur: NEGATIVE

## 2021-03-20 MED ORDER — METRONIDAZOLE 500 MG PO TABS: 500.0000 mg | ORAL_TABLET | Freq: Two times a day (BID) | ORAL | 0 refills | Status: DC

## 2021-03-20 NOTE — ED Triage Notes (Signed)
Lower abd pain, states vaginal area feels irritated.

## 2021-03-20 NOTE — ED Provider Notes (Signed)
[facility]   MRN: [id number] DOB: June 10, 1985  Subjective:   Diane Peterson is a 35 y.o. female presenting for 5 day history of acute onset pelvic pain, vaginal irritation and slight discharge. Denies dysuria, urinary frequency, urgency. No concerns for an STI. Would like testing for BV. Does not want testing for an STI.   No current facility-administered medications for this encounter.  Current Outpatient Medications:    buPROPion (WELLBUTRIN XL) 150 MG 24 hr tablet, Take 150 mg by mouth daily., Disp: , Rfl:    ALPRAZolam (XANAX) 0.25 MG tablet, alprazolam 0.25 mg tablet, Disp: , Rfl:    levonorgestrel-ethinyl estradiol (SEASONALE) 0.15-0.03 MG tablet, Take 1 tablet by mouth daily., Disp: , Rfl:    Multiple Vitamin (MULITIVITAMIN WITH MINERALS) TABS, Take 1 tablet by mouth daily.  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 No pertinent surgical history.  Family History  Problem Relation Age of Onset   Alcohol abuse Father    Drug abuse Father    Diabetes Father    Seizures  Father    Peripheral vascular disease Maternal Grandmother    Cancer Maternal Grandmother        uterine   Peripheral vascular disease Maternal Grandfather    Cancer Maternal Grandfather        prostate   Lupus Paternal Grandmother    Thyroid disease Paternal Grandmother    Diabetes Paternal Grandmother    Hypertension Paternal Grandmother    Peripheral vascular disease Paternal Grandmother    Stroke Paternal Grandfather     Social History   Tobacco Use   Smoking status: Never   Smokeless tobacco: Never  Substance Use Topics   Alcohol use: No   Drug use: No    ROS   Objective:   Vitals: BP (!) 154/91 (BP Location: Right Arm)   Pulse 97   Temp 98.1 F (36.7 C) (Oral)   Resp 18   SpO2 98%   BP Readings from Last 3 Encounters:  03/20/21 (!) 154/91  05/20/20 119/85  04/05/11 128/72   Physical Exam Constitutional:      General: She is not in acute distress.    Appearance: Normal appearance. She is well-developed. She is not ill-appearing, toxic-appearing or diaphoretic.  HENT:     Head: Normocephalic and atraumatic.     Nose: Nose normal.     Mouth/Throat:     Mouth: Mucous membranes are moist.     Pharynx:  Oropharynx is clear.  Eyes:     General: No scleral icterus.       Right eye: No discharge.        Left eye: No discharge.     Extraocular Movements: Extraocular movements intact.     Conjunctiva/sclera: Conjunctivae normal.     Pupils: Pupils are equal, round, and reactive to light.  Cardiovascular:     Rate and Rhythm: Normal rate.  Pulmonary:     Effort: Pulmonary effort is normal.  Abdominal:     General: Bowel sounds are normal. There is no distension.     Palpations: Abdomen is soft. There is no mass.     Tenderness: There is no abdominal tenderness. There is no right CVA tenderness, left CVA tenderness, guarding or rebound.  Skin:    General: Skin is warm and dry.  Neurological:     General: No focal deficit present.     Mental Status: She  is alert and oriented to person, place, and time.  Psychiatric:        Mood and Affect: Mood normal.        Behavior: Behavior normal.        Thought Content: Thought content normal.        Judgment: Judgment normal.    Results for orders placed or performed during the hospital encounter of 03/20/21 (from the past 24 hour(s))  POCT urinalysis dipstick     Status: Abnormal   Collection Time: 03/20/21 10:34 AM  Result Value Ref Range   Color, UA yellow yellow   Clarity, UA clear clear   Glucose, UA negative negative mg/dL   Bilirubin, UA negative negative   Ketones, POC UA negative negative mg/dL   Spec Grav, UA >=1.030 (A) 1.010 - 1.025   Blood, UA trace-intact (A) negative   pH, UA 5.5 5.0 - 8.0   Protein Ur, POC negative negative mg/dL   Urobilinogen, UA 0.2 0.2 or 1.0 E.U./dL   Nitrite, UA Negative Negative   Leukocytes, UA Negative Negative  POCT urine pregnancy     Status: None   Collection Time: 03/20/21 10:49 AM  Result Value Ref Range   Preg Test, Ur Negative Negative    Assessment and Plan :   PDMP not reviewed this encounter.  1. Pelvic pain   2. Vaginal discharge    Patient declined STI testing.  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  Urine culture, cervical swab results pending.  At discharge, patient wanted to discuss her blood pressure.  She has significant anxiety and was previously on sertraline but is now on Wellbutrin and had her dose increased in the past 2 months.  She is now no scintillations in her blood pressure.  She is concerned about this given family history of high blood pressure.  She is also on cholesterol medication already.  No headache, dizziness, confusion, weakness or numbness or tingling.  Recommended revisiting the use of Wellbutrin given her risk factors for high blood pressure with her PCP.  Patient will otherwise monitor her blood pressure at home.  Counseled patient on potential for adverse effects with medications  prescribed/recommended today, ER and return-to-clinic precautions discussed, patient verbalized understanding.    Jaynee Eagles, PA-C 03/20/21 1105

## 2021-03-21 LAB — URINE CULTURE: Culture: 30000 — AB

## 2021-03-22 LAB — CERVICOVAGINAL ANCILLARY ONLY
Bacterial Vaginitis (gardnerella): NEGATIVE
Candida Glabrata: NEGATIVE
Candida Vaginitis: NEGATIVE
Comment: NEGATIVE
Comment: NEGATIVE
Comment: NEGATIVE

## 2021-04-20 ENCOUNTER — Other Ambulatory Visit: Payer: Self-pay

## 2021-04-20 ENCOUNTER — Ambulatory Visit: Payer: 59 | Admitting: Dermatology

## 2021-04-20 DIAGNOSIS — Z86018 Personal history of other benign neoplasm: Secondary | ICD-10-CM

## 2021-04-20 DIAGNOSIS — Z1283 Encounter for screening for malignant neoplasm of skin: Secondary | ICD-10-CM | POA: Diagnosis not present

## 2021-05-14 ENCOUNTER — Encounter: Payer: Self-pay | Admitting: Dermatology

## 2021-05-14 NOTE — Progress Notes (Signed)
° °  New Patient   Subjective  Diane Peterson is a 36 y.o. female who presents for the following: Annual Exam (Here for annual skin exam. History of atypical moles. ).  General skin examination, no nail changes.  History of atypical moles Location:  Duration:  Quality:  Associated Signs/Symptoms: Modifying Factors:  Severity:  Timing: Context:    The following portions of the chart were reviewed this encounter and updated as appropriate:  Tobacco   Allergies   Meds   Problems   Med Hx   Surg Hx   Fam Hx       Objective  Well appearing patient in no apparent distress; mood and affect are within normal limits. Skin examination, no new or recurrent atypical pigmented spots.  All pigmented lesions checked with dermoscopy.    A full examination was performed including scalp, head, eyes, ears, nose, lips, neck, chest, axillae, abdomen, back, buttocks, bilateral upper extremities, bilateral lower extremities, hands, feet, fingers, toes, fingernails, and toenails. All findings within normal limits unless otherwise noted below.  Areas beneath undergarments not fully examined.   Assessment & Plan  Skin exam for malignant neoplasm  Yearly skin check, encouraged to self examine twice annually.  Continue ultraviolet protection.

## 2022-04-15 ENCOUNTER — Other Ambulatory Visit: Payer: Self-pay

## 2022-04-15 ENCOUNTER — Encounter: Payer: Self-pay | Admitting: Emergency Medicine

## 2022-04-15 ENCOUNTER — Ambulatory Visit
Admission: EM | Admit: 2022-04-15 | Discharge: 2022-04-15 | Disposition: A | Discharge status: Home / Self Care | Payer: 59 | Attending: Nurse Practitioner | Admitting: Nurse Practitioner

## 2022-04-15 DIAGNOSIS — Z1152 Encounter for screening for COVID-19: Secondary | ICD-10-CM | POA: Insufficient documentation

## 2022-04-15 DIAGNOSIS — J069 Acute upper respiratory infection, unspecified: Secondary | ICD-10-CM | POA: Insufficient documentation

## 2022-04-15 MED ORDER — PSEUDOEPH-BROMPHEN-DM 30-2-10 MG/5ML PO SYRP: 5.0000 mL | ORAL_SOLUTION | Freq: Four times a day (QID) | ORAL | 0 refills | Status: DC | PRN

## 2022-04-15 MED ORDER — FLUTICASONE PROPIONATE 50 MCG/ACT NA SUSP: 2.0000 | Freq: Every day | NASAL | 0 refills | Status: DC

## 2022-04-15 NOTE — ED Triage Notes (Signed)
Pt reports cough, nasal congestion, headache, lower back pain x2 days.

## 2022-04-15 NOTE — ED Provider Notes (Signed)
[facility]    CSN: [id number] Arrival date & time: 04/15/22  0802      History   Chief Complaint Chief Complaint  Patient presents with   Cough    HPI Diane Peterson is a 37 y.o. female.   The history is provided by the patient.   The patient presents with a 2-day history of headache, cough, nasal congestion, runny nose, and low back pain.  Patient denies fever, chills, sore throat, wheezing, abdominal pain, nausea, vomiting, or diarrhea.  Patient reports that her children were diagnosed with influenza B approximately 2 weeks ago.  She reports she has been taking over-the-counter Tylenol and ibuprofen for her symptoms along with Mucinex.  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   History reviewed. No pertinent surgical history.  OB History     Gravida  2   Para  2   Term  2   Preterm      AB      Living  2      SAB      IAB      Ectopic      Multiple      Live Births  2            Home Medications    Prior to Admission medications   Medication Sig Start Date End Date Taking? Authorizing Provider  ALPRAZolam Duanne Moron) 0.25 MG tablet alprazolam 0.25 mg tablet 07/22/19   [provider]  brompheniramine-pseudoephedrine-DM 30-2-10 MG/5ML syrup Take 5 mLs by mouth 4 (four) times daily as needed. 04/15/22   Greggory Safranek-Warren, Alda Lea, NP   buPROPion (WELLBUTRIN XL) 150 MG 24 hr tablet Take 150 mg by mouth daily.    [provider]  buPROPion (WELLBUTRIN) 75 MG tablet TAKE 1 TABLET(75 MG) BY MOUTH DAILY 04/14/21   [provider]  fluticasone (FLONASE) 50 MCG/ACT nasal spray Place 2 sprays into both nostrils daily. 04/15/22   Timmi Devora-Warren, Alda Lea, NP  influenza vaccine (FLUCELVAX QUADRIVALENT) 0.5 ML injection Flucelvax Quad 2020-2021 (PF) 60 mcg (15 mcg x 4)/0.5 mL IM syringe  ADM 0.5ML IM UTD    [provider]  levonorgestrel-ethinyl estradiol (NORDETTE) 0.15-30 MG-MCG tablet Take by mouth.    [provider]  levonorgestrel-ethinyl estradiol (SEASONALE) 0.15-0.03 MG tablet Take 1 tablet by mouth daily. 04/24/20   [provider]  Multiple Vitamin (MULITIVITAMIN WITH MINERALS) TABS Take 1 tablet by mouth daily.      [provider]  rosuvastatin (CRESTOR) 10 MG tablet Take 10 mg by mouth at bedtime. 02/13/20   [provider]  triamcinolone (KENALOG) 0.1 % Apply 1 application topically 2 (two) times daily. 05/20/20   Faustino Congress, NP    Family History Family History  Problem Relation Age of Onset   Alcohol abuse Father    Drug abuse Father  Diabetes Father    Seizures Father    Peripheral vascular disease Maternal Grandmother    Cancer Maternal Grandmother        uterine   Peripheral vascular disease Maternal Grandfather    Cancer Maternal Grandfather        prostate   Lupus Paternal Grandmother    Thyroid disease Paternal Grandmother    Diabetes Paternal Grandmother    Hypertension Paternal Grandmother    Peripheral vascular disease Paternal Grandmother    Stroke Paternal Grandfather     Social History Social History   Tobacco Use   Smoking status: Never   Smokeless tobacco: Never  Substance Use Topics   Alcohol use: No   Drug use: No     Allergies   Penicillins and Sulfa antibiotics   Review of Systems Review of Systems Per  HPI  Physical Exam Triage Vital Signs ED Triage Vitals  Enc Vitals Group     BP 04/15/22 0824 (!) 137/94     Pulse Rate 04/15/22 0824 (!) 140     Resp 04/15/22 0824 20     Temp 04/15/22 0824 98.4 F (36.9 C)     Temp Source 04/15/22 0824 Oral     SpO2 04/15/22 0824 98 %     Weight --      Height --      Head Circumference --      Peak Flow --      Pain Score 04/15/22 0826 7     Pain Loc --      Pain Edu? --      Excl. in [place]? --    No data found.  Updated Vital Signs BP (!) 137/94 (BP Location: Right Arm)   Pulse (!) 140   Temp 98.4 F (36.9 C) (Oral)   Resp 20   LMP 02/06/2022 (Approximate) Comment: pt reports takes birth control pills and only has period every 3 months.  SpO2 98%   Breastfeeding No   Visual Acuity Right Eye Distance:   Left Eye Distance:   Bilateral Distance:    Right Eye Near:   Left Eye Near:    Bilateral Near:     Physical Exam Vitals and nursing note reviewed.  Constitutional:      General: She is not in acute distress.    Appearance: Normal appearance. She is well-developed.  HENT:     Head: Normocephalic.     Right Ear: Tympanic membrane, ear canal and external ear normal.     Left Ear: Tympanic membrane, ear canal and external ear normal.     Nose: Congestion and rhinorrhea present.     Right Turbinates: Enlarged and swollen.     Left Turbinates: Enlarged and swollen.     Right Sinus: No maxillary sinus tenderness or frontal sinus tenderness.     Left Sinus: No maxillary sinus tenderness or frontal sinus tenderness.     Mouth/Throat:     Lips: Pink.     Mouth: Mucous membranes are moist.     Pharynx: Uvula midline. Posterior oropharyngeal erythema present. No pharyngeal swelling, oropharyngeal exudate or uvula swelling.  Eyes:     Extraocular Movements: Extraocular movements intact.     Conjunctiva/sclera: Conjunctivae normal.     Pupils: Pupils are equal, round, and reactive to light.  Cardiovascular:     Rate and Rhythm:  Regular rhythm. Tachycardia present.     Pulses: Normal pulses.     Heart sounds: Normal heart sounds.  Pulmonary:  Effort: Pulmonary effort is normal.     Breath sounds: Normal breath sounds.  Abdominal:     General: Bowel sounds are normal. There is no distension.     Palpations: Abdomen is soft.     Tenderness: There is no abdominal tenderness. There is no guarding or rebound.  Genitourinary:    Vagina: Normal. No vaginal discharge.  Musculoskeletal:     Cervical back: Normal range of motion.     Lumbar back: No swelling or spasms. Normal range of motion. Negative right straight leg raise test and negative left straight leg raise test.  Lymphadenopathy:     Cervical: No cervical adenopathy.  Skin:    General: Skin is warm and dry.     Findings: No erythema or rash.  Neurological:     General: No focal deficit present.     Mental Status: She is alert and oriented to person, place, and time.     Cranial Nerves: No cranial nerve deficit.  Psychiatric:        Mood and Affect: Mood normal.        Behavior: Behavior normal.        Thought Content: Thought content normal.        Judgment: Judgment normal.      UC Treatments / Results  Labs (all labs ordered are listed, but only abnormal results are displayed) Labs Reviewed  SARS CORONAVIRUS 2 (TAT 6-24 HRS)    EKG   Radiology No results found.  Procedures Procedures (including critical care time)  Medications Ordered in UC Medications - No data to display  Initial Impression / Assessment and Plan / UC Course  I have reviewed the triage vital signs and the nursing notes.  Pertinent labs & imaging results that were available during my care of the patient were reviewed by me and considered in my medical decision making (see chart for details).   The patient is well-appearing, she is tachycardic, and mildly hypertensive, but is in no acute distress.  Symptoms are consistent with a viral upper respiratory  infection with cough.  COVID test is pending.  Patient declines antiviral therapy if her COVID test is positive.  Will provide symptomatic treatment with Bromfed-DM for her cough, and fluticasone 50 mcg nasal spray for her nasal congestion.  Supportive care recommendations were provided to the patient to include increasing fluids, allowing for plenty of rest, and use of Tylenol or ibuprofen as needed for pain or discomfort.  Discussed viral etiology with the patient along with indications of when follow-up may be necessary.  Patient voices understanding.  All questions were answered.  Patient stable for discharge.  Work note was provided.  Final Clinical Impressions(s) / UC Diagnoses   Final diagnoses:  Viral upper respiratory tract infection with cough  Encounter for screening for COVID-19     Discharge Instructions      COVID test is pending. You will be contacted if the test result is positive. As discussed, you decline antiviral treatment if the test is positive. Take medication as prescribed. Increase fluids and allow for plenty of rest. Recommend Tylenol or ibuprofen as needed for pain, fever, or general discomfort. Warm salt water gargles 3-4 times daily to help with throat pain or discomfort. Recommend using a humidifier at bedtime during sleep to help with cough and nasal congestion. Sleep elevated on 2 pillows while symptoms persist. Symptoms may persist up to 10 days. If symptoms extend beyond that time or if they worsen before that time,  follow-up with our clinic or with your primary care physician.      ED Prescriptions     Medication Sig Dispense Auth. Provider   brompheniramine-pseudoephedrine-DM 30-2-10 MG/5ML syrup  (Status: Discontinued) Take 5 mLs by mouth 4 (four) times daily as needed. 140 mL Arlissa Monteverde-Warren, Alda Lea, NP   fluticasone (FLONASE) 50 MCG/ACT nasal spray  (Status: Discontinued) Place 2 sprays into both nostrils daily. 16 g Shyheem Whitham-Warren, Alda Lea, NP    brompheniramine-pseudoephedrine-DM 30-2-10 MG/5ML syrup Take 5 mLs by mouth 4 (four) times daily as needed. 140 mL Emelin Dascenzo-Warren, Alda Lea, NP   fluticasone (FLONASE) 50 MCG/ACT nasal spray Place 2 sprays into both nostrils daily. 16 g Ludie Hudon-Warren, Alda Lea, NP      PDMP not reviewed this encounter.   Tish Men, NP 04/15/22 [phone]

## 2022-04-15 NOTE — Discharge Instructions (Signed)
COVID test is pending. You will be contacted if the test result is positive. As discussed, you decline antiviral treatment if the test is positive. Take medication as prescribed. Increase fluids and allow for plenty of rest. Recommend Tylenol or ibuprofen as needed for pain, fever, or general discomfort. Warm salt water gargles 3-4 times daily to help with throat pain or discomfort. Recommend using a humidifier at bedtime during sleep to help with cough and nasal congestion. Sleep elevated on 2 pillows while symptoms persist. Symptoms may persist up to 10 days. If symptoms extend beyond that time or if they worsen before that time, follow-up with our clinic or with your primary care physician.

## 2022-04-16 LAB — SARS CORONAVIRUS 2 (TAT 6-24 HRS): SARS Coronavirus 2: NEGATIVE

## 2023-04-01 ENCOUNTER — Inpatient Hospital Stay
Admission: RE | Admit: 2023-04-01 | Discharge: 2023-04-01 | Discharge status: Home / Self Care | Payer: 59 | Source: Ambulatory Visit | Attending: Nurse Practitioner

## 2023-04-01 VITALS — BP 145/88 | HR 82 | Temp 98.1°F | Resp 18

## 2023-04-01 DIAGNOSIS — N760 Acute vaginitis: Secondary | ICD-10-CM

## 2023-04-01 DIAGNOSIS — R109 Unspecified abdominal pain: Secondary | ICD-10-CM

## 2023-04-01 LAB — POCT URINALYSIS DIP (MANUAL ENTRY)
Bilirubin, UA: NEGATIVE
Glucose, UA: NEGATIVE mg/dL
Ketones, POC UA: NEGATIVE mg/dL
Nitrite, UA: NEGATIVE
Protein Ur, POC: NEGATIVE mg/dL
Spec Grav, UA: 1.025 (ref 1.010–1.025)
Urobilinogen, UA: 0.2 U/dL
pH, UA: 6 (ref 5.0–8.0)

## 2023-04-01 MED ORDER — FLUCONAZOLE 150 MG PO TABS: ORAL_TABLET | ORAL | 0 refills | Status: DC

## 2023-04-01 NOTE — Discharge Instructions (Signed)
Urine culture and cytology swab are pending.  You will be contacted if the pending test results are abnormal.  You also have access to the results via MyChart. Take medication as prescribed. Warm salt water soaks as needed for vaginal irritation or discomfort. Recommend taking a probiotic with all antibiotics in the future. Follow-up as needed.

## 2023-04-01 NOTE — ED Provider Notes (Signed)
[facility]    CSN: [id number] Arrival date & time: 04/01/23  0272      History   Chief Complaint No chief complaint on file.   HPI Diane Peterson is a 37 y.o. female.   The history is provided by the patient.   Patient presents for complaints of lower abdominal pain, urinary frequency, and vaginal irritation that is been present for the past 1-1/2 weeks.  Patient reports that she recently finished doxycycline for pneumonia 1 day ago.  She denies fever, chills, nausea, vomiting, diarrhea, vaginal odor.  She does complain of some urinary frequency.  Describes her discharge as "white."  Reports she has been taking Azo for symptoms with minimal relief.  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   History reviewed. No pertinent surgical history.  OB History     Gravida  2   Para  2   Term  2   Preterm      AB      Living  2      SAB      IAB      Ectopic      Multiple      Live Births  2            Home Medications    Prior to Admission medications   Medication Sig Start Date End Date Taking? Authorizing Provider  fluconazole (DIFLUCAN) 150 MG tablet Take 1 tablet by mouth today.  May repeat every 3 days up to 2 additional doses for continued symptoms. 04/01/23  Yes Leath-Warren, Sadie Haber, NP  ALPRAZolam Prudy Feeler) 0.25 MG tablet alprazolam 0.25 mg tablet 07/22/19   [provider]  influenza vaccine (FLUCELVAX QUADRIVALENT) 0.5 ML  injection Flucelvax Quad 2020-2021 (PF) 60 mcg (15 mcg x 4)/0.5 mL IM syringe  ADM 0.5ML IM UTD    [provider]  levonorgestrel-ethinyl estradiol (NORDETTE) 0.15-30 MG-MCG tablet Take by mouth.    [provider]  levonorgestrel-ethinyl estradiol (SEASONALE) 0.15-0.03 MG tablet Take 1 tablet by mouth daily. 04/24/20   [provider]  Multiple Vitamin (MULITIVITAMIN WITH MINERALS) TABS Take 1 tablet by mouth daily.      [provider]  rosuvastatin (CRESTOR) 10 MG tablet Take 10 mg by mouth at bedtime. 02/13/20   [provider]  triamcinolone (KENALOG) 0.1 % Apply 1 application topically 2 (two) times daily. 05/20/20   Moshe Cipro, FNP    Family History Family History  Problem Relation Age of Onset   Alcohol abuse Father    Drug abuse Father    Diabetes Father    Seizures Father    Peripheral vascular disease Maternal Grandmother    Cancer Maternal Grandmother        uterine   Peripheral vascular disease Maternal Grandfather    Cancer Maternal Grandfather        prostate   Lupus Paternal Grandmother    Thyroid disease Paternal Grandmother    Diabetes  Paternal Grandmother    Hypertension Paternal Grandmother    Peripheral vascular disease Paternal Grandmother    Stroke Paternal Grandfather     Social History Social History   Tobacco Use   Smoking status: Never   Smokeless tobacco: Never  Substance Use Topics   Alcohol use: No   Drug use: No     Allergies   Penicillins and Sulfa antibiotics   Review of Systems Review of Systems Per HPI  Physical Exam Triage Vital Signs ED Triage Vitals  Encounter Vitals Group     BP 04/01/23 0819 (!) 145/88     Systolic BP Percentile --      Diastolic BP Percentile --      Pulse Rate 04/01/23 0819 82     Resp 04/01/23 0819 18     Temp 04/01/23 0819 98.1 F (36.7 C)     Temp Source 04/01/23 0819 Oral     SpO2 04/01/23 0819 98 %     Weight --      Height --      Head  Circumference --      Peak Flow --      Pain Score 04/01/23 0820 0     Pain Loc --      Pain Education --      Exclude from Growth Chart --    No data found.  Updated Vital Signs BP (!) 145/88 (BP Location: Right Arm)   Pulse 82   Temp 98.1 F (36.7 C) (Oral)   Resp 18   SpO2 98%   Visual Acuity Right Eye Distance:   Left Eye Distance:   Bilateral Distance:    Right Eye Near:   Left Eye Near:    Bilateral Near:     Physical Exam Vitals and nursing note reviewed.  Constitutional:      General: She is not in acute distress.    Appearance: Normal appearance.  HENT:     Head: Normocephalic.  Eyes:     Extraocular Movements: Extraocular movements intact.     Pupils: Pupils are equal, round, and reactive to light.  Cardiovascular:     Rate and Rhythm: Normal rate and regular rhythm.     Pulses: Normal pulses.     Heart sounds: Normal heart sounds.  Pulmonary:     Effort: Pulmonary effort is normal. No respiratory distress.     Breath sounds: Normal breath sounds. No stridor. No wheezing, rhonchi or rales.  Abdominal:     General: Bowel sounds are normal.     Palpations: Abdomen is soft.     Tenderness: There is no abdominal tenderness.  Musculoskeletal:     Cervical back: Normal range of motion.  Lymphadenopathy:     Cervical: No cervical adenopathy.  Skin:    General: Skin is warm and dry.  Neurological:     General: No focal deficit present.     Mental Status: She is alert and oriented to person, place, and time.  Psychiatric:        Mood and Affect: Mood normal.        Behavior: Behavior normal.      UC Treatments / Results  Labs (all labs ordered are listed, but only abnormal results are displayed) Labs Reviewed  POCT URINALYSIS DIP (MANUAL ENTRY) - Abnormal; Notable for the following components:      Result Value   Blood, UA trace-intact (*)    Leukocytes, UA Small (1+) (*)    All other components within  normal limits    EKG   Radiology No  results found.  Procedures Procedures (including critical care time)  Medications Ordered in UC Medications - No data to display  Initial Impression / Assessment and Plan / UC Course  I have reviewed the triage vital signs and the nursing notes.  Pertinent labs & imaging results that were available during my care of the patient were reviewed by me and considered in my medical decision making (see chart for details).  Cytology swab is pending, urinalysis does not indicate a clear UTI, will await culture results.  Will start patient on fluconazole 150 mg for vaginitis, most likely caused by the recent antibiotic use.  Will await cytology swab before starting additional medications as patient just completed an antibiotic.  Patient would like [facility] if her test for BV is positive.  Patient was in agreement with this plan.  Supportive care recommendations were provided and discussed with the patient to include increasing fluids, using a probiotic with all antibiotics in the future, and condom use with each sexual encounter.  Patient was in agreement with this plan of care and verbalized understanding.  All questions were answered.  Patient stable for discharge.  Final Clinical Impressions(s) / UC Diagnoses   Final diagnoses:  Acute vaginitis  Abdominal pain, unspecified abdominal location     Discharge Instructions      Urine culture and cytology swab are pending.  You will be contacted if the pending test results are abnormal.  You also have access to the results via MyChart. Take medication as prescribed. Warm salt water soaks as needed for vaginal irritation or discomfort. Recommend taking a probiotic with all antibiotics in the future. Follow-up as needed.     ED Prescriptions     Medication Sig Dispense Auth. Provider   fluconazole (DIFLUCAN) 150 MG tablet Take 1 tablet by mouth today.  May repeat every 3 days up to 2 additional doses for continued symptoms. 3 tablet  Leath-Warren, Sadie Haber, NP      PDMP not reviewed this encounter.   Abran Cantor, NP 04/01/23 [phone]

## 2023-04-01 NOTE — ED Triage Notes (Signed)
Lower abd pain and urinary frequency x 1.5 weeks.  Has been taking AZO.  Was taking an antibiotic and finished that yesterday (was taking for pneumonia)   States is having a white vaginal discharge.

## 2023-04-02 LAB — URINE CULTURE

## 2023-04-04 LAB — CERVICOVAGINAL ANCILLARY ONLY
Bacterial Vaginitis (gardnerella): NEGATIVE
Candida Glabrata: NEGATIVE
Candida Vaginitis: NEGATIVE
Comment: NEGATIVE
Comment: NEGATIVE
Comment: NEGATIVE

## 2023-10-19 ENCOUNTER — Ambulatory Visit: Admitting: Women's Health

## 2023-10-23 ENCOUNTER — Encounter: Payer: Self-pay | Admitting: Women's Health

## 2023-10-23 ENCOUNTER — Other Ambulatory Visit (HOSPITAL_COMMUNITY)
Admission: RE | Admit: 2023-10-23 | Discharge: 2023-10-23 | Disposition: A | Discharge status: Home / Self Care | Source: Ambulatory Visit | Attending: Women's Health | Admitting: Women's Health

## 2023-10-23 ENCOUNTER — Ambulatory Visit: Admitting: Women's Health

## 2023-10-23 ENCOUNTER — Other Ambulatory Visit: Payer: Self-pay | Admitting: Women's Health

## 2023-10-23 VITALS — BP 126/85 | HR 79 | Ht 62.0 in | Wt 125.8 lb

## 2023-10-23 DIAGNOSIS — Z1501 Genetic susceptibility to malignant neoplasm of breast: Secondary | ICD-10-CM | POA: Diagnosis not present

## 2023-10-23 DIAGNOSIS — Z01419 Encounter for gynecological examination (general) (routine) without abnormal findings: Secondary | ICD-10-CM | POA: Diagnosis not present

## 2023-10-23 DIAGNOSIS — R6882 Decreased libido: Secondary | ICD-10-CM

## 2023-10-23 DIAGNOSIS — Z124 Encounter for screening for malignant neoplasm of cervix: Secondary | ICD-10-CM

## 2023-10-23 DIAGNOSIS — Z9189 Other specified personal risk factors, not elsewhere classified: Secondary | ICD-10-CM | POA: Diagnosis not present

## 2023-10-23 DIAGNOSIS — Z1509 Genetic susceptibility to other malignant neoplasm: Secondary | ICD-10-CM

## 2023-10-23 NOTE — Progress Notes (Signed)
 WELL-WOMAN EXAMINATION Patient name: Diane Peterson MRN [id number]  Date of birth: [birth year] Chief Complaint:   Annual Exam  History of Present Illness:   Diane Peterson is a 38 y.o. [id number] Caucasian female being seen today for a routine well-woman exam.  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 Both sisters also tested positive.  Decreased sex drive; sometimes feels dry/scratchy during sex, lubrication helps  PCP: [facility]      does not desire labs Patient's last menstrual period was 07/25/2023 (approximate). The current method of family planning is OCP (estrogen/progesterone).  Last pap June 2022. Results were: NILM w/ HRHPV not done. H/O abnormal pap: yes couple of years ago Last mammogram: this year somewhere in [place]-was normal.   Last colonoscopy: never. Results were: N/A. Family h/o colorectal cancer: yes MGM     10/23/2023    8:51 AM  Depression screen PHQ 2/9  Decreased Interest 0  Down, Depressed, Hopeless 1  PHQ - 2 Score 1  Altered sleeping 0  Tired, decreased energy 0  Change in appetite 0  Feeling bad or failure about yourself  0  Trouble concentrating 0  Moving slowly or fidgety/restless 0  Suicidal thoughts 0  PHQ-9 Score 1        10/23/2023    8:51 AM  GAD 7 : Generalized Anxiety Score  Nervous, Anxious, on Edge 2  Control/stop worrying 3  Worry too much - different things 3  Trouble relaxing 2  Restless 1  Easily annoyed or irritable 2  Afraid - awful might happen 3  Total GAD 7 Score 16     Review of Systems:   Pertinent items are noted in HPI Denies any headaches, blurred vision, fatigue, shortness of breath, chest pain, abdominal pain, abnormal vaginal discharge/itching/odor/irritation, problems with periods, bowel movements, urination, or intercourse unless otherwise stated above. Pertinent History Reviewed:  Reviewed past medical,surgical, social and family  history.  Reviewed problem list, medications and allergies. Physical Assessment:   Vitals:   10/23/23 0846  BP: 126/85  Pulse: 79  Weight: 125 lb 12.8 oz (57.1 kg)  Height: 5' 2 (1.575 m)  Body mass index is 23.01 kg/m.        Physical Examination:   General appearance - well appearing, and in no distress  Mental status - alert, oriented to person, place, and time  Psych:  She has a normal mood and affect  Skin - warm and dry, normal color, no suspicious lesions noted  Chest - effort normal, all lung fields clear to auscultation bilaterally  Heart - normal rate and regular rhythm  Neck:  midline trachea, no thyromegaly or nodules  Breasts - breasts appear normal, has implants, no suspicious masses, no skin or nipple changes or  axillary nodes  Abdomen - soft, nontender, nondistended, no masses or organomegaly  Pelvic - VULVA: normal appearing vulva with no masses, tenderness or lesions  VAGINA: normal appearing vagina with normal color and discharge, no lesions  CERVIX: normal appearing cervix without discharge or lesions, no CMT  Thin prep pap is done w/ HR HPV cotesting  UTERUS: uterus is felt to be normal size, shape, consistency and nontender   ADNEXA: No adnexal masses or tenderness noted.  Extremities:  No swelling or varicosities noted  Chaperone: Aleck Blase  No results found for this or any previous visit (from the past 24 hours).  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   Meds: No orders of the defined types were placed in this encounter.   Follow-up: Return for 1st available to discuss surgery w/ Dr. Jayne; get last mammo report please.  Suzen JONELLE Fetters CNM, [facility] 10/23/2023 9:17 AM

## 2023-10-23 NOTE — Patient Instructions (Signed)
Addyi

## 2023-10-24 ENCOUNTER — Encounter: Payer: Self-pay | Admitting: Women's Health

## 2023-10-24 DIAGNOSIS — Z9189 Other specified personal risk factors, not elsewhere classified: Secondary | ICD-10-CM | POA: Insufficient documentation

## 2023-10-26 ENCOUNTER — Ambulatory Visit: Payer: Self-pay | Admitting: Women's Health

## 2023-10-26 LAB — CYTOLOGY - PAP
Comment: NEGATIVE
Diagnosis: NEGATIVE
High risk HPV: NEGATIVE

## 2023-11-24 ENCOUNTER — Ambulatory Visit: Admitting: Obstetrics & Gynecology

## 2023-12-21 ENCOUNTER — Encounter: Payer: Self-pay | Admitting: Women's Health

## 2024-02-25 ENCOUNTER — Other Ambulatory Visit: Payer: Self-pay

## 2024-02-25 ENCOUNTER — Emergency Department (HOSPITAL_COMMUNITY)

## 2024-02-25 ENCOUNTER — Emergency Department (HOSPITAL_COMMUNITY)
Admission: EM | Admit: 2024-02-25 | Discharge: 2024-02-25 | Disposition: A | Discharge status: Home / Self Care | Attending: Emergency Medicine | Admitting: Emergency Medicine

## 2024-02-25 ENCOUNTER — Encounter (HOSPITAL_COMMUNITY): Payer: Self-pay | Admitting: *Deleted

## 2024-02-25 DIAGNOSIS — N12 Tubulo-interstitial nephritis, not specified as acute or chronic: Secondary | ICD-10-CM

## 2024-02-25 DIAGNOSIS — R10A2 Flank pain, left side: Secondary | ICD-10-CM | POA: Diagnosis present

## 2024-02-25 LAB — BASIC METABOLIC PANEL WITH GFR
Anion gap: 13 (ref 5–15)
BUN: 11 mg/dL (ref 6–20)
CO2: 23 mmol/L (ref 22–32)
Calcium: 8.7 mg/dL — ABNORMAL LOW (ref 8.9–10.3)
Chloride: 101 mmol/L (ref 98–111)
Creatinine, Ser: 0.73 mg/dL (ref 0.44–1.00)
GFR, Estimated: >60 mL/min (ref >60)
Glucose, Bld: 143 mg/dL — ABNORMAL HIGH (ref 70–99)
Potassium: 3.2 mmol/L — ABNORMAL LOW (ref 3.5–5.1)
Sodium: 138 mmol/L (ref 135–145)

## 2024-02-25 LAB — URINALYSIS, ROUTINE W REFLEX MICROSCOPIC
Bilirubin Urine: NEGATIVE
Glucose, UA: NEGATIVE mg/dL
Ketones, ur: NEGATIVE mg/dL
Nitrite: NEGATIVE
Protein, ur: 30 mg/dL — AB
RBC / HPF: >50 RBC/hpf (ref 0–5)
Specific Gravity, Urine: 1.021 (ref 1.005–1.030)
pH: 5 (ref 5.0–8.0)

## 2024-02-25 LAB — CBC WITH DIFFERENTIAL/PLATELET
Abs Immature Granulocytes: 0.03 K/uL (ref 0.00–0.07)
Basophils Absolute: 0.1 K/uL (ref 0.0–0.1)
Basophils Relative: 1 %
Eosinophils Absolute: 0.3 K/uL (ref 0.0–0.5)
Eosinophils Relative: 4 %
HCT: 42.3 % (ref 36.0–46.0)
Hemoglobin: 14.2 g/dL (ref 12.0–15.0)
Immature Granulocytes: 0 %
Lymphocytes Relative: 24 %
Lymphs Abs: 2.1 K/uL (ref 0.7–4.0)
MCH: 28.6 pg (ref 26.0–34.0)
MCHC: 33.6 g/dL (ref 30.0–36.0)
MCV: 85.3 fL (ref 80.0–100.0)
Monocytes Absolute: 0.6 K/uL (ref 0.1–1.0)
Monocytes Relative: 7 %
Neutro Abs: 5.7 K/uL (ref 1.7–7.7)
Neutrophils Relative %: 64 %
Platelets: 310 K/uL (ref 150–400)
RBC: 4.96 MIL/uL (ref 3.87–5.11)
RDW: 12.8 % (ref 11.5–15.5)
WBC: 8.8 K/uL (ref 4.0–10.5)
nRBC: 0 % (ref 0.0–0.2)

## 2024-02-25 LAB — POC URINE PREG, ED: Preg Test, Ur: NEGATIVE

## 2024-02-25 MED ORDER — ONDANSETRON HCL 4 MG/2ML IJ SOLN
4.0000 mg | Freq: Once | INTRAMUSCULAR | Status: AC
  Administered 2024-02-25: 4 mg via INTRAVENOUS
  Filled 2024-02-25: qty 2

## 2024-02-25 MED ORDER — KETOROLAC TROMETHAMINE 15 MG/ML IJ SOLN
15.0000 mg | Freq: Once | INTRAMUSCULAR | Status: AC
  Administered 2024-02-25: 15 mg via INTRAVENOUS
  Filled 2024-02-25: qty 1

## 2024-02-25 MED ORDER — POTASSIUM CHLORIDE CRYS ER 20 MEQ PO TBCR
40.0000 meq | EXTENDED_RELEASE_TABLET | Freq: Once | ORAL | Status: AC
  Administered 2024-02-25: 40 meq via ORAL
  Filled 2024-02-25: qty 2

## 2024-02-25 MED ORDER — CEFPODOXIME PROXETIL 200 MG PO TABS: 200.0000 mg | ORAL_TABLET | Freq: Two times a day (BID) | ORAL | 0 refills | Status: AC

## 2024-02-25 MED ORDER — NAPROXEN 375 MG PO TABS: 375.0000 mg | ORAL_TABLET | Freq: Two times a day (BID) | ORAL | 0 refills | Status: AC

## 2024-02-25 MED ORDER — SODIUM CHLORIDE 0.9 % IV SOLN
1.0000 g | Freq: Once | INTRAVENOUS | Status: AC
  Administered 2024-02-25: 1 g via INTRAVENOUS
  Filled 2024-02-25: qty 10

## 2024-02-25 MED ORDER — ONDANSETRON 4 MG PO TBDP: 4.0000 mg | ORAL_TABLET | Freq: Four times a day (QID) | ORAL | 0 refills | Status: AC | PRN

## 2024-02-25 NOTE — ED Provider Notes (Signed)
 [place] EMERGENCY DEPARTMENT AT [facility] Provider Note   CSN: [id number] Arrival date & time: 02/25/24  9196     Patient presents with: Flank Pain   Diane Peterson is a 38 y.o. female.  She otherwise healthy presents ER today complaining of left flank pain rating into her abdomen started this morning.  She did cause her to be very nauseated but she has not been vomiting.    Denies any fevers or chills, she is not sure if she had any dysuria.  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      Flank Pain       Prior to Admission medications   Medication Sig Start Date End Date Taking? Authorizing Provider  ALPRAZolam (XANAX) 0.25 MG tablet alprazolam 0.25 mg tablet 07/22/19   [provider]  fluconazole  (DIFLUCAN ) 150 MG tablet Take 1 tablet by mouth today.  May repeat every 3 days up to 2 additional doses for continued symptoms. Patient not taking: Reported on 10/23/2023 04/01/23   Leath-Warren, Etta PARAS, NP  influenza vaccine (FLUCELVAX QUADRIVALENT) 0.5 ML injection Flucelvax Quad 2020-2021 (PF) 60 mcg (15 mcg x 4)/0.5 mL IM syringe  ADM 0.5ML IM UTD Patient not taking: Reported on 10/23/2023    [provider]  levonorgestrel-ethinyl estradiol (NORDETTE) 0.15-30 MG-MCG tablet Take by mouth. Patient not taking: Reported on 10/23/2023    [provider]  levonorgestrel-ethinyl estradiol (SEASONALE) 0.15-0.03 MG tablet Take 1 tablet by mouth daily. 04/24/20   [provider]  Multiple Vitamin (MULITIVITAMIN WITH MINERALS) TABS Take 1 tablet by mouth daily.      [provider]  rosuvastatin (CRESTOR) 10 MG tablet Take 10 mg by mouth at bedtime. 02/13/20   [provider]  triamcinolone  (KENALOG ) 0.1 % Apply 1  application topically 2 (two) times daily. Patient not taking: Reported on 10/23/2023 05/20/20   Alvia Corean CROME, FNP    Allergies: Penicillins and Sulfa antibiotics    Review of Systems  Genitourinary:  Positive for flank pain.    Updated Vital Signs BP (!) 151/103 (BP Location: Right Arm)   Pulse (!) 101   Temp 97.7 F (36.5 C) (Oral)   Resp 20   Ht 5' 2 (1.575 m)   Wt 59 kg   LMP 01/25/2024   SpO2 100%   BMI 23.78 kg/m   Physical Exam Vitals and nursing note reviewed.  Constitutional:      General: She is not in acute distress.    Appearance: She is well-developed.  HENT:     Head: Normocephalic and atraumatic.  Eyes:     Extraocular Movements: Extraocular movements intact.     Conjunctiva/sclera: Conjunctivae normal.     Pupils: Pupils are equal, round, and reactive to light.  Cardiovascular:     Rate and Rhythm: Normal rate and regular rhythm.     Heart sounds: No murmur heard. Pulmonary:     Effort: Pulmonary effort is normal. No respiratory distress.     Breath sounds: Normal breath sounds.  Abdominal:     Palpations: Abdomen is soft.     Tenderness: There is no abdominal tenderness. There is left CVA tenderness. There is no right CVA tenderness, guarding or rebound.  Musculoskeletal:        General: No  swelling.     Cervical back: Neck supple.  Skin:    General: Skin is warm and dry.     Capillary Refill: Capillary refill takes less than 2 seconds.  Neurological:     General: No focal deficit present.     Mental Status: She is alert and oriented to person, place, and time.     Motor: No weakness.  Psychiatric:        Mood and Affect: Mood normal.     (all labs ordered are listed, but only abnormal results are displayed) Labs Reviewed  BASIC METABOLIC PANEL WITH GFR - Abnormal; Notable for the following components:      Result Value   Potassium 3.2 (*)    Glucose, Bld 143 (*)    Calcium 8.7 (*)    All other components within normal limits   CBC WITH DIFFERENTIAL/PLATELET  URINALYSIS, ROUTINE W REFLEX MICROSCOPIC  POC URINE PREG, ED    EKG: None  Radiology: No results found.   Procedures   Medications Ordered in the ED  ketorolac (TORADOL) 15 MG/ML injection 15 mg (15 mg Intravenous Given 02/25/24 0942)  ondansetron  (ZOFRAN ) injection 4 mg (4 mg Intravenous Given 02/25/24 0942)    Clinical Course as of 02/25/24 1028  Sun Feb 25, 2024  1028 Point-of-care pregnancy test negative, did not crossover in system but confirmed with nurse tech [CB]    Clinical Course User Index [CB] Suellen Sherran LABOR, PA-C                                 Medical Decision Making Parental diagnosis includes but not limited to   Amount and/or Complexity of Data Reviewed Labs: ordered. Radiology: ordered.  Risk Prescription drug management.  This patient presents to the ED for concern of left flank pain, this involves an extensive number of treatment options, and is a complaint that carries with it a high risk of complications and morbidity.  The differential diagnosis includes ureterolithiasis, pyelonephritis, UTI, diverticulitis, ovarian cyst, ovarian torsion, other    Co morbidities that complicate the patient evaluation :   History of gestational diabetes   Additional history obtained:  Additional history obtained from an electronic medical record External records from outside source obtained and reviewed including prior notes and labs   Lab Tests:  I Ordered, and personally interpreted labs.  The pertinent results include:   CBC with no leukocytosis.  No anemia BMP with potassium 3.2 Urinalysis small leukocytes, greater than 50 red blood cells per high-power field, 11-20 white blood cells per high-power field and rare bacteria  Imaging Studies ordered:  I ordered imaging studies including CT abdomen pelvis which shows no acute findings I independently visualized and interpreted imaging within scope of identifying  emergent findings  I agree with the radiologist interpretation     Problem List / ED Course / Critical interventions / Medication management  Patient presents with left flank pain that started abruptly this morning and has been constant, radiates in the left lower quadrant.  Patient appears to be mildly uncomfortable on exam she does have left CVA tenderness.  She reports she has been dealing with chronic pelvic pain though is unable to definitively say if she is having dysuria or not as she has chronic burning genital pain.  Follows with OB/GYN for this.  Labs show mild hypokalemia but no leukocytosis, urinalysis does show small leukocytes, greater than 50 red blood cells  per high-power field, 11-20 white blood cells per high-power field and rare bacteria.  CT abdomen pelvis did not show any acute findings, given finding of UTI and left CVA tenderness will treat for possible pyelonephritis, urine was sent for culture.  Advised on close outpatient follow-up.  Mildly decreased potassium was repleted here.  Glucose 143, patient was concerned because of history of gestational diabetes, she will follow-up with her PCP for this.  She has a listed allergy to penicillins but states this was a fixed full-body rash that lasted 2 to 3 days it was unsure if related to the penicillin or not, reports she has tolerated Keflex in the past.  Will treat with cephalosporin for the UTI. I ordered medication including Toradol and Zofran  for flank pain and nausea Reevaluation of the patient after these medicines showed that the patient improved I have reviewed the patients home medicines and have made adjustments as needed       Final diagnoses:  None    ED Discharge Orders     None          Suellen Sherran DELENA DEVONNA 02/25/24 1157    Yolande Lamar BROCKS, MD 02/27/24 1252

## 2024-02-25 NOTE — Discharge Instructions (Signed)
 You are seen in the ER today for pain in the left side of your back and left abdomen, your urine shows signs of infection and you have tenderness over your kidney, your CT scan did not show any other abnormalities including kidney stone, so we are going to treat for a kidney infection.  I am prescribing medication at home for pain and nausea and antibiotics.  Follow-up close with your PCP, they can follow your urine culture.  Come back to the ER if you have new or worsening symptoms.

## 2024-02-25 NOTE — ED Triage Notes (Signed)
 Pt c/o severe left side flank pain with radiation to abdomen; pt states the pain started this am and is causing her to have hot flashes with feeling of wanting to pass out due to the pain  Pt denies any urinary sx

## 2024-02-26 LAB — POC URINE PREG, ED: Preg Test, Ur: NEGATIVE

## 2024-02-26 LAB — URINE CULTURE: Culture: <10000 — AB

## 2024-03-01 ENCOUNTER — Other Ambulatory Visit (HOSPITAL_COMMUNITY): Payer: Self-pay | Admitting: Obstetrics & Gynecology

## 2024-03-01 MED ORDER — TAMSULOSIN HCL 0.4 MG PO CAPS: 0.4000 mg | ORAL_CAPSULE | Freq: Every day | ORAL | 1 refills | Status: AC

## 2024-03-01 MED ORDER — PROMETHAZINE HCL 25 MG PO TABS: 25.0000 mg | ORAL_TABLET | Freq: Four times a day (QID) | ORAL | 1 refills | Status: AC | PRN

## 2024-03-18 ENCOUNTER — Ambulatory Visit: Admitting: Obstetrics & Gynecology

## 2024-03-18 ENCOUNTER — Encounter: Payer: Self-pay | Admitting: Obstetrics & Gynecology

## 2024-03-18 VITALS — BP 158/99 | HR 103 | Ht 62.0 in | Wt 131.0 lb

## 2024-03-18 DIAGNOSIS — N761 Subacute and chronic vaginitis: Secondary | ICD-10-CM

## 2024-03-18 MED ORDER — CLINDAMYCIN PHOSPHATE 100 MG VA SUPP: 200.0000 mg | Freq: Every day | VAGINAL | 0 refills | Status: AC

## 2024-03-18 NOTE — Progress Notes (Unsigned)
 Chief Complaint  Patient presents with   Painful intercourse, recent UTI      Diane y.o. [id number] Patient's last menstrual period was 01/25/2024. The current method of family planning is OCP (estrogen/progesterone).  Outpatient Encounter Medications as of 03/18/2024  Medication Sig   ALPRAZolam (XANAX) 0.25 MG tablet alprazolam 0.25 mg tablet (Patient taking differently: as needed.)   clindamycin  (CLEOCIN ) 100 MG vaginal suppository Place 2 suppositories (200 mg total) vaginally at bedtime.   levonorgestrel-ethinyl estradiol (SEASONALE) 0.15-0.03 MG tablet Take 1 tablet by mouth daily.   Multiple Vitamin (MULITIVITAMIN WITH MINERALS) TABS Take 1 tablet by mouth daily.     naproxen  (NAPROSYN ) 375 MG tablet Take 1 tablet (375 mg total) by mouth 2 (two) times daily. (Patient taking differently: Take 375 mg by mouth as needed.)   ondansetron  (ZOFRAN -ODT) 4 MG disintegrating tablet Take 1 tablet (4 mg total) by mouth every 6 (six) hours as needed for nausea or vomiting.   promethazine  (PHENERGAN ) 25 MG tablet Take 1 tablet (25 mg total) by mouth every 6 (six) hours as needed for nausea.   influenza vaccine (FLUCELVAX QUADRIVALENT) 0.5 ML injection Flucelvax Quad 2020-2021 (PF) 60 mcg (15 mcg x 4)/0.5 mL IM syringe  ADM 0.5ML IM UTD (Patient not taking: Reported on 03/18/2024)   levonorgestrel-ethinyl estradiol (NORDETTE) 0.15-30 MG-MCG tablet Take by mouth. (Patient not taking: Reported on 03/18/2024)   rosuvastatin (CRESTOR) 10 MG tablet Take 10 mg by mouth at bedtime. (Patient not taking: Reported on 03/18/2024)   tamsulosin  (FLOMAX ) 0.4 MG CAPS capsule Take 1 capsule (0.4 mg total) by mouth daily. (Patient not taking: Reported on 03/18/2024)   triamcinolone  (KENALOG ) 0.1 % Apply 1 application topically 2 (two) times daily. (Patient not taking: Reported on 03/18/2024)   [DISCONTINUED] fluconazole  (DIFLUCAN ) 150 MG tablet Take 1 tablet by mouth today.  May repeat every 3 days up to 2 additional  doses for continued symptoms. (Patient not taking: Reported on 03/18/2024)   No facility-administered encounter medications on file as of 03/18/2024.    Subjective Pt with 6 month history of discomfort/pain with intercourse, insertional not deep (exam ruled out bump dyspareunia) Like my skin is tearing Never happened before  Was better when she took doxycycline in September and cephalosporin for UTI (culture negative) in [place] Now worse again Some discharge Some discomfort with orgasm, crampy pain Sonogram was normal CT did not show a stone when she had colicky flank pain recently but she got better with flomax  and phenergan (ureteral target) UC was S epi <1000 col/ml so likely contmainated insignificant   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   right thigh-moderate   Atypical nevus x 2 09/25/2000   right upper arm,sup-slight -WS, right upper arm inf.-slight   Gestational diabetes    first preg   High cholesterol     Past Surgical History:  Procedure Laterality Date   BREAST ENHANCEMENT SURGERY Bilateral 2021    OB History     Gravida  2   Para  2   Term  2   Preterm      AB      Living  2      SAB      IAB  Ectopic      Multiple      Live Births  2           Allergies  Allergen Reactions   Penicillins    Sulfa Antibiotics Rash and Other (See Comments)    High temperature    Social History   Socioeconomic History   Marital status: Married    Spouse name: Not on file   Number of children: Not on file   Years of education: Not on file   Highest education level: Not on file  Occupational History   Not on file  Tobacco Use   Smoking status: Never   Smokeless  tobacco: Never  Vaping Use   Vaping status: Never Used  Substance and Sexual Activity   Alcohol use: No   Drug use: No   Sexual activity: Yes    Birth control/protection: Pill  Other Topics Concern   Not on file  Social History Narrative   Not on file   Social Drivers of Health   Financial Resource Strain: Low Risk (12/17/2023)   Received from [facility]   Overall Financial Resource Strain (CARDIA)    How hard is it for you to pay for the very basics like food, housing, medical care, and heating?: Not very hard  Food Insecurity: No Food Insecurity (12/17/2023)   Received from [facility]   Hunger Vital Sign    Within the past 12 months, you worried that your food would run out before you got the money to buy more.: Never true    Within the past 12 months, the food you bought just didn't last and you didn't have money to get more.: Never true  Transportation Needs: No Transportation Needs (12/17/2023)   Received from [facility] - Transportation    In the past 12 months, has lack of transportation kept you from medical appointments or from getting medications?: No    In the past 12 months, has lack of transportation kept you from meetings, work, or from getting things needed for daily living?: No  Physical Activity: Insufficiently Active (12/17/2023)   Received from [facility]   Exercise Vital Sign    On average, how many days per week do you engage in moderate to strenuous exercise (like a brisk walk)?: 2 days    On average, how many minutes do you engage in exercise at this level?: 40 min  Stress: No Stress Concern Present (12/17/2023)   Received from [facility] of Occupational Health - Occupational Stress Questionnaire    Do you feel stress - tense, restless, nervous, or anxious, or unable to sleep at night because your mind is troubled all the time - these days?: Only a little  Recent Concern: Stress - Stress Concern Present (10/23/2023)    [employer] of Occupational Health - Occupational Stress Questionnaire    Feeling of Stress: To some extent  Social Connections: Socially Integrated (12/17/2023)   Received from [facility]   Social Network    How would you rate your social network (family, work, friends)?: Good participation with social networks    Family History  Problem Relation Age of Onset   Stroke Paternal Grandfather    Lupus Paternal Grandmother    Thyroid disease Paternal Grandmother    Diabetes Paternal Grandmother    Hypertension Paternal Grandmother    Peripheral vascular disease Paternal Grandmother    Peripheral vascular disease Maternal Grandmother    Cancer Maternal Grandmother  uterine   Peripheral vascular disease Maternal Grandfather    Cancer Maternal Grandfather        prostate   Alcohol abuse Father    Drug abuse Father    Diabetes Father    Seizures Father     Medications:       Current Outpatient Medications:    ALPRAZolam (XANAX) 0.25 MG tablet, alprazolam 0.25 mg tablet (Patient taking differently: as needed.), Disp: , Rfl:    clindamycin  (CLEOCIN ) 100 MG vaginal suppository, Place 2 suppositories (200 mg total) vaginally at bedtime., Disp: 42 suppository, Rfl: 0   levonorgestrel-ethinyl estradiol (SEASONALE) 0.15-0.03 MG tablet, Take 1 tablet by mouth daily., Disp: , Rfl:    Multiple Vitamin (MULITIVITAMIN WITH MINERALS) TABS, Take 1 tablet by mouth daily.  , Disp: , Rfl:    naproxen  (NAPROSYN ) 375 MG tablet, Take 1 tablet (375 mg total) by mouth 2 (two) times daily. (Patient taking differently: Take 375 mg by mouth as needed.), Disp: 20 tablet, Rfl: 0   ondansetron  (ZOFRAN -ODT) 4 MG disintegrating tablet, Take 1 tablet (4 mg total) by mouth every 6 (six) hours as needed for nausea or vomiting., Disp: 15 tablet, Rfl: 0   promethazine  (PHENERGAN ) 25 MG tablet, Take 1 tablet (25 mg total) by mouth every 6 (six) hours as needed for nausea., Disp: 30 tablet, Rfl: 1   influenza  vaccine (FLUCELVAX QUADRIVALENT) 0.5 ML injection, Flucelvax Quad 2020-2021 (PF) 60 mcg (15 mcg x 4)/0.5 mL IM syringe  ADM 0.5ML IM UTD (Patient not taking: Reported on 03/18/2024), Disp: , Rfl:    levonorgestrel-ethinyl estradiol (NORDETTE) 0.15-30 MG-MCG tablet, Take by mouth. (Patient not taking: Reported on 03/18/2024), Disp: , Rfl:    rosuvastatin (CRESTOR) 10 MG tablet, Take 10 mg by mouth at bedtime. (Patient not taking: Reported on 03/18/2024), Disp: , Rfl:    tamsulosin  (FLOMAX ) 0.4 MG CAPS capsule, Take 1 capsule (0.4 mg total) by mouth daily. (Patient not taking: Reported on 03/18/2024), Disp: 30 capsule, Rfl: 1   triamcinolone  (KENALOG ) 0.1 %, Apply 1 application topically 2 (two) times daily. (Patient not taking: Reported on 03/18/2024), Disp: 30 g, Rfl: 0  Objective Blood pressure (!) 158/99, pulse (!) 103, height 5' 2 (1.575 m), weight 131 lb (59.4 kg), last menstrual period 01/25/2024.  General WDWN female NAD Vulva:  normal appearing vulva with no masses, tenderness or lesions, a little erythema Vagina:  normal mucosa, moderate grey discharge Cervix:  Normal no lesions, no CMT  Uterus:  normal size, contour, position, consistency, mobility, non-tender Adnexa: ovaries:present,  normal adnexa in size, nontender and no masses  Wet mount: No yeast and absolutely no BV No lactobacilli seen Moderate WBC population No trichomonas   Pertinent ROS No burning with urination, frequency or urgency No nausea, vomiting or diarrhea Nor fever chills or other constitutional symptoms   Labs or studies Reviewed her recent labs, urine cultures and CT images    Impression + Management Plan: Diagnoses this Encounter::   ICD-10-CM   1. 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  reassess in 4 weeks after off 1 week, rec probiotics        Medications  prescribed during  this encounter: Meds ordered this encounter  Medications   clindamycin  (CLEOCIN ) 100 MG vaginal suppository    Sig: Place 2 suppositories (200 mg total) vaginally at bedtime.    Dispense:  42  suppository    Refill:  0   Don't make those so had to go to 2% creaam, 5 grams, 100 mg /night  for 21 Was not EPIC listed for OP so called in the Rx to her pharmacy   Labs or Scans Ordered during this encounter: No orders of the defined types were placed in this encounter.     Follow up Return in about 4 weeks (around 04/15/2024), or if symptoms worsen or fail to improve.

## 2024-04-22 ENCOUNTER — Ambulatory Visit: Admitting: Obstetrics & Gynecology

## 2024-04-22 ENCOUNTER — Encounter: Payer: Self-pay | Admitting: Obstetrics & Gynecology

## 2024-04-22 VITALS — BP 153/86 | HR 90 | Ht 61.0 in | Wt 130.0 lb

## 2024-04-22 DIAGNOSIS — N941 Unspecified dyspareunia: Secondary | ICD-10-CM

## 2024-04-22 DIAGNOSIS — N761 Subacute and chronic vaginitis: Secondary | ICD-10-CM

## 2024-04-22 NOTE — Progress Notes (Signed)
 Follow up appointment for results: DIV  Chief Complaint  Patient presents with   Follow-up    Blood pressure (!) 153/86, pulse 90, height 5' 1 (1.549 m), weight 130 lb (59 kg).  Wet prep: Normal epithelial population no BV Some WBC normal pop Severe lack of lactobacilli No yeast or trichomonas  Symptoms: significant improvement in mucosal symptoms Still some positional deeper discomfort and cramping post coital which I think is related to bump and PG   MEDS ordered this encounter: No orders of the defined types were placed in this encounter.   Orders for this encounter: No orders of the defined types were placed in this encounter.   Impression + Management Plan   ICD-10-CM   1. 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 Dyspareunia: post coital cramping-->no ejaculation vaginally to see if PG mediated  N94.10       Follow Up: Return in about 1 month (around 05/23/2024) for Follow up, with Dr Jayne.     All questions were answered.  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   right thigh-moderate   Atypical nevus x 2 09/25/2000   right upper arm,sup-slight -WS, right upper arm inf.-slight   Gestational diabetes    first preg   High cholesterol     Past Surgical History:  Procedure Laterality Date   BREAST ENHANCEMENT SURGERY Bilateral 2021    OB History     Gravida  2   Para  2   Term  2   Preterm      AB      Living  2      SAB      IAB      Ectopic      Multiple      Live Births  2           Allergies[1]  Social History   Socioeconomic History    Marital status: Married    Spouse name: Not on file   Number of children: Not on file   Years of education: Not on file   Highest education level: Not on file  Occupational History   Not on file  Tobacco Use   Smoking status: Never   Smokeless tobacco: Never  Vaping Use   Vaping status: Never Used  Substance and Sexual Activity   Alcohol use: No   Drug use: No   Sexual activity: Yes    Birth control/protection: Pill  Other Topics Concern   Not on file  Social History Narrative   Not on file   Social Drivers of Health   Tobacco Use: Low Risk (04/22/2024)   Patient History    Smoking Tobacco Use: Never    Smokeless Tobacco Use: Never    Passive Exposure: Not on file  Financial Resource Strain: Low Risk (12/17/2023)   Received from [facility]   Overall Financial Resource Strain (CARDIA)    How hard is it for you to pay for the very basics like food, housing, medical care, and heating?: Not very hard  Food Insecurity: No Food Insecurity (12/17/2023)   Received from [facility]  Epic    Within the past 12 months, you worried that your food would run out before you got the money to buy more.: Never true    Within the past 12 months, the food you bought just didn't last and you didn't have money to get more.: Never true  Transportation Needs: No Transportation Needs (12/17/2023)   Received from [facility]    In the past 12 months, has lack of transportation kept you from medical appointments or from getting medications?: No    In the past 12 months, has lack of transportation kept you from meetings, work, or from getting things needed for daily living?: No  Physical Activity: Insufficiently Active (12/17/2023)   Received from [facility]   Exercise Vital Sign    On average, how many days per week do you engage in moderate to strenuous exercise (like a brisk walk)?: 2 days    On average, how many minutes do you engage in exercise at this level?: 40 min  Stress: No  Stress Concern Present (12/17/2023)   Received from [facility] of Occupational Health - Occupational Stress Questionnaire    Do you feel stress - tense, restless, nervous, or anxious, or unable to sleep at night because your mind is troubled all the time - these days?: Only a little  Recent Concern: Stress - Stress Concern Present (10/23/2023)   [employer] of Occupational Health - Occupational Stress Questionnaire    Feeling of Stress: To some extent  Social Connections: Socially Integrated (12/17/2023)   Received from [facility]   Social Network    How would you rate your social network (family, work, friends)?: Good participation with social networks  Depression (PHQ2-9): Low Risk (10/23/2023)   Depression (PHQ2-9)    PHQ-2 Score: 1  Alcohol Screen: Low Risk (10/23/2023)   Alcohol Screen    Last Alcohol Screening Score (AUDIT): 0  Housing: Low Risk (12/17/2023)   Received from [facility]    In the last 12 months, was there a time when you were not able to pay the mortgage or rent on time?: No    In the past 12 months, how many times have you moved where you were living?: 0    At any time in the past 12 months, were you homeless or living in a shelter (including now)?: No  Utilities: Not At Risk (12/17/2023)   Received from [facility]    In the past 12 months has the electric, gas, oil, or water company threatened to shut off services in your home?: No  Health Literacy: Not on file    Family History  Problem Relation Age of Onset   Stroke Paternal Grandfather    Lupus Paternal Grandmother    Thyroid disease Paternal Grandmother    Diabetes Paternal Grandmother    Hypertension Paternal Grandmother    Peripheral vascular disease Paternal Grandmother    Peripheral vascular disease Maternal Grandmother    Cancer Maternal Grandmother        uterine   Peripheral vascular disease Maternal Grandfather    Cancer Maternal Grandfather         prostate   Alcohol abuse Father    Drug abuse Father    Diabetes Father    Seizures Father        [1]  Allergies Allergen Reactions   Penicillins    Sulfa Antibiotics Rash and Other (See Comments)  High temperature

## 2024-05-23 ENCOUNTER — Ambulatory Visit: Admitting: Obstetrics & Gynecology
